# Patient Record
Sex: Male | Born: 1975 | Race: White | Hispanic: No | Marital: Married | Smoking: Never smoker
Health system: Southern US, Community
[De-identification: ages and names within clinical notes are randomized; demographics above are authoritative.]

## PROBLEM LIST (undated history)

## (undated) DIAGNOSIS — I5022 Chronic systolic (congestive) heart failure: Secondary | ICD-10-CM

## (undated) DIAGNOSIS — R55 Syncope and collapse: Secondary | ICD-10-CM

## (undated) DIAGNOSIS — R001 Bradycardia, unspecified: Secondary | ICD-10-CM

## (undated) HISTORY — DX: Syncope and collapse: R55

## (undated) HISTORY — DX: Bradycardia, unspecified: R00.1

## (undated) HISTORY — DX: Chronic systolic (congestive) heart failure: I50.22

## (undated) HISTORY — PX: CERVICAL SPINE SURGERY: SHX589

---

## 2007-05-03 ENCOUNTER — Ambulatory Visit: Payer: Self-pay | Admitting: Chiropractic Medicine

## 2007-06-10 ENCOUNTER — Ambulatory Visit: Payer: Self-pay | Admitting: Pain Medicine

## 2007-06-19 ENCOUNTER — Ambulatory Visit: Payer: Self-pay | Admitting: Pain Medicine

## 2007-07-09 ENCOUNTER — Ambulatory Visit: Payer: Self-pay | Admitting: Pain Medicine

## 2007-07-15 ENCOUNTER — Ambulatory Visit: Payer: Self-pay | Admitting: Pain Medicine

## 2007-08-22 ENCOUNTER — Ambulatory Visit: Payer: Self-pay | Admitting: Neurology

## 2007-09-03 ENCOUNTER — Ambulatory Visit: Payer: Self-pay | Admitting: Pain Medicine

## 2007-09-04 ENCOUNTER — Encounter: Payer: Self-pay | Admitting: Neurology

## 2007-09-25 ENCOUNTER — Ambulatory Visit: Payer: Self-pay | Admitting: Pain Medicine

## 2007-09-30 ENCOUNTER — Encounter: Payer: Self-pay | Admitting: Neurology

## 2010-06-22 ENCOUNTER — Ambulatory Visit: Payer: Self-pay | Admitting: Family Medicine

## 2013-01-31 ENCOUNTER — Ambulatory Visit: Payer: Self-pay | Admitting: Neurology

## 2013-01-31 LAB — CSF CELL COUNT WITH DIFFERENTIAL
CSF Tube #: 1
Eosinophil: 0 %
Lymphocytes: 75 %
Neutrophils: 0 %
Neutrophils: 0 %
Other Cells: 0 %
Other Cells: 0 %
RBC (CSF): 49 /mm3
RBC (CSF): 99 /mm3
WBC (CSF): 11 /mm3
WBC (CSF): 5 /mm3

## 2013-01-31 LAB — PROTIME-INR: INR: 1

## 2013-01-31 LAB — GLUCOSE, CSF: Glucose, CSF: 62 mg/dL (ref 40–75)

## 2013-01-31 LAB — PLATELET COUNT: Platelet: 205 10*3/uL (ref 150–440)

## 2013-02-03 LAB — CSF CULTURE W GRAM STAIN

## 2013-02-20 ENCOUNTER — Ambulatory Visit: Payer: Self-pay | Admitting: Neurology

## 2013-04-11 ENCOUNTER — Ambulatory Visit: Payer: Self-pay | Admitting: Internal Medicine

## 2013-04-18 ENCOUNTER — Ambulatory Visit: Payer: Self-pay | Admitting: Internal Medicine

## 2013-08-18 ENCOUNTER — Ambulatory Visit: Payer: Self-pay | Admitting: Physician Assistant

## 2013-08-18 LAB — COMPREHENSIVE METABOLIC PANEL
ALBUMIN: 4 g/dL (ref 3.4–5.0)
ANION GAP: 6 — AB (ref 7–16)
Alkaline Phosphatase: 84 U/L
BUN: 16 mg/dL (ref 7–18)
Bilirubin,Total: 0.7 mg/dL (ref 0.2–1.0)
CREATININE: 0.94 mg/dL (ref 0.60–1.30)
Calcium, Total: 9.2 mg/dL (ref 8.5–10.1)
Chloride: 103 mmol/L (ref 98–107)
Co2: 32 mmol/L (ref 21–32)
EGFR (Non-African Amer.): >60
Glucose: 92 mg/dL (ref 65–99)
Osmolality: 282 (ref 275–301)
Potassium: 4.3 mmol/L (ref 3.5–5.1)
SGOT(AST): 21 U/L (ref 15–37)
SGPT (ALT): 40 U/L (ref 12–78)
SODIUM: 141 mmol/L (ref 136–145)
TOTAL PROTEIN: 7.5 g/dL (ref 6.4–8.2)

## 2013-08-18 LAB — CBC WITH DIFFERENTIAL/PLATELET
Basophil #: 0 10*3/uL (ref 0.0–0.1)
Basophil %: 0.5 %
EOS PCT: 1.5 %
Eosinophil #: 0.1 10*3/uL (ref 0.0–0.7)
HCT: 46.3 % (ref 40.0–52.0)
HGB: 15.5 g/dL (ref 13.0–18.0)
Lymphocyte #: 2.4 10*3/uL (ref 1.0–3.6)
Lymphocyte %: 30 %
MCH: 29.7 pg (ref 26.0–34.0)
MCHC: 33.5 g/dL (ref 32.0–36.0)
MCV: 89 fL (ref 80–100)
MONO ABS: 0.9 x10 3/mm (ref 0.2–1.0)
Monocyte %: 11.2 %
NEUTROS ABS: 4.5 10*3/uL (ref 1.4–6.5)
Neutrophil %: 56.8 %
PLATELETS: 225 10*3/uL (ref 150–440)
RBC: 5.23 10*6/uL (ref 4.40–5.90)
RDW: 12.8 % (ref 11.5–14.5)
WBC: 8 10*3/uL (ref 3.8–10.6)

## 2013-08-18 LAB — URINALYSIS, COMPLETE
BILIRUBIN, UR: NEGATIVE
BLOOD: NEGATIVE
Bacteria: NEGATIVE
GLUCOSE, UR: NEGATIVE mg/dL (ref 0–75)
KETONE: NEGATIVE
Leukocyte Esterase: NEGATIVE
Nitrite: NEGATIVE
PROTEIN: NEGATIVE
Ph: 6.5 (ref 4.5–8.0)
Specific Gravity: 1.005 (ref 1.003–1.030)
Squamous Epithelial: NONE SEEN
WBC UR: NONE SEEN /HPF (ref 0–5)

## 2013-08-19 LAB — URINE CULTURE

## 2013-11-21 DIAGNOSIS — M5412 Radiculopathy, cervical region: Secondary | ICD-10-CM | POA: Insufficient documentation

## 2014-03-06 DIAGNOSIS — M5136 Other intervertebral disc degeneration, lumbar region: Secondary | ICD-10-CM | POA: Insufficient documentation

## 2014-03-10 ENCOUNTER — Ambulatory Visit: Payer: Self-pay | Admitting: Physical Medicine and Rehabilitation

## 2014-09-12 ENCOUNTER — Ambulatory Visit
Admission: EM | Admit: 2014-09-12 | Discharge: 2014-09-12 | Disposition: A | Discharge status: Home / Self Care | Payer: Managed Care, Other (non HMO) | Attending: Family Medicine | Admitting: Family Medicine

## 2014-09-12 ENCOUNTER — Encounter: Payer: Self-pay | Admitting: Gynecology

## 2014-09-12 DIAGNOSIS — J029 Acute pharyngitis, unspecified: Secondary | ICD-10-CM | POA: Diagnosis not present

## 2014-09-12 LAB — RAPID STREP SCREEN (MED CTR MEBANE ONLY): STREPTOCOCCUS, GROUP A SCREEN (DIRECT): NEGATIVE

## 2014-09-12 NOTE — ED Provider Notes (Signed)
CSN: [id number]     Arrival date & time 09/12/14  0825 History   First MD Initiated Contact with Patient 09/12/14 [phone]     Chief Complaint  Patient presents with  . Sore Throat   (Consider location/radiation/quality/duration/timing/severity/associated sxs/prior Treatment) HPI      39 year old male presents for evaluation of sore throat. This started about 3 days ago after [place] [facility] [place]. He describes an extremely sore throat and difficulty swallowing  He also has had some rhinorrhea and a very mild cough. Symptoms have been gradually worsening. No fever, chills, NVD, headache, chest pain, shortness of breath, sinus pain or pressure   History reviewed. No pertinent past medical history. History reviewed. No pertinent past surgical history. History reviewed. No pertinent family history. History  Substance Use Topics  . Smoking status: Never Smoker   . Smokeless tobacco: Not on file  . Alcohol Use: No    Review of Systems  Constitutional: Negative for fever and chills.  HENT: Positive for rhinorrhea and sore throat. Negative for ear pain and sinus pressure.   Respiratory: Positive for cough. Negative for shortness of breath.   Cardiovascular: Negative for chest pain.  All other systems reviewed and are negative.   Allergies  Review of patient's allergies indicates no known allergies.  Home Medications   Prior to Admission medications   Not on File   BP 133/95 mmHg  Pulse 49  Temp(Src) 96 F (35.6 C) (Tympanic)  Ht 5\' 10"  (1.778 m)  Wt 169 lb (76.658 kg)  BMI 24.25 kg/m2  SpO2 98% Physical Exam  Constitutional: He is oriented to person, place, and time. He appears well-developed and well-nourished. No distress.  HENT:  Head: Normocephalic and atraumatic.  Right Ear: External ear normal.  Left Ear: External ear normal.  Nose: Nose normal. Right sinus exhibits no maxillary sinus tenderness and no frontal sinus tenderness. Left sinus exhibits no  maxillary sinus tenderness and no frontal sinus tenderness.  Mouth/Throat: Uvula is midline, oropharynx is clear and moist and mucous membranes are normal. No oropharyngeal exudate or posterior oropharyngeal erythema.  Eyes: Conjunctivae are normal.  Neck: Normal range of motion. Neck supple.  Cardiovascular: Normal rate, regular rhythm and normal heart sounds.   Pulmonary/Chest: Effort normal and breath sounds normal. No respiratory distress.  Lymphadenopathy:    He has no cervical adenopathy.  Neurological: He is alert and oriented to person, place, and time. Coordination normal.  Skin: Skin is warm and dry. No rash noted. He is not diaphoretic.  Psychiatric: He has a normal mood and affect. Judgment normal.  Nursing note and vitals reviewed.   ED Course  Procedures (including critical care time) Labs Review Labs Reviewed  RAPID STREP SCREEN    Imaging Review No results found.   MDM   1. Pharyngitis     the physical exam is completely normal, throat appears normal. Most likely viral. Treat symptomatically, I have written him a prescription for Magic mouthwash as well. He will follow-up in 4-5 days if he is not getting any better or sooner if worsening      Graylon GoodZachary H Oliviya Gilkison, PA-C 09/12/14 [phone]

## 2014-09-12 NOTE — Discharge Instructions (Signed)
Your sore throat is most likely being caused by a virus. The Magic mouthwash medication as needed for the sore throat. Also take ibuprofen and Tylenol, and sore throat lozenges as necessary. If this is not getting any better in about 5 days, follow-up here or with your primary care provider for reevaluation. You do not need any antibiotics today.  Pharyngitis Pharyngitis is redness, pain, and swelling (inflammation) of your pharynx.  CAUSES  Pharyngitis is usually caused by infection. Most of the time, these infections are from viruses (viral) and are part of a cold. However, sometimes pharyngitis is caused by bacteria (bacterial). Pharyngitis can also be caused by allergies. Viral pharyngitis may be spread from person to person by coughing, sneezing, and personal items or utensils (cups, forks, spoons, toothbrushes). Bacterial pharyngitis may be spread from person to person by more intimate contact, such as kissing.  SIGNS AND SYMPTOMS  Symptoms of pharyngitis include:   Sore throat.   Tiredness (fatigue).   Low-grade fever.   Headache.  Joint pain and muscle aches.  Skin rashes.  Swollen lymph nodes.  Plaque-like film on throat or tonsils (often seen with bacterial pharyngitis). DIAGNOSIS  Your health care provider will ask you questions about your illness and your symptoms. Your medical history, along with a physical exam, is often all that is needed to diagnose pharyngitis. Sometimes, a rapid strep test is done. Other lab tests may also be done, depending on the suspected cause.  TREATMENT  Viral pharyngitis will usually get better in 3-4 days without the use of medicine. Bacterial pharyngitis is treated with medicines that kill germs (antibiotics).  HOME CARE INSTRUCTIONS   Drink enough water and fluids to keep your urine clear or pale yellow.   Only take over-the-counter or prescription medicines as directed by your health care provider:   If you are prescribed  antibiotics, make sure you finish them even if you start to feel better.   Do not take aspirin.   Get lots of rest.   Gargle with 8 oz of salt water ( tsp of salt per 1 qt of water) as often as every 1-2 hours to soothe your throat.   Throat lozenges (if you are not at risk for choking) or sprays may be used to soothe your throat. SEEK MEDICAL CARE IF:   You have large, tender lumps in your neck.  You have a rash.  You cough up green, yellow-brown, or bloody spit. SEEK IMMEDIATE MEDICAL CARE IF:   Your neck becomes stiff.  You drool or are unable to swallow liquids.  You vomit or are unable to keep medicines or liquids down.  You have severe pain that does not go away with the use of recommended medicines.  You have trouble breathing (not caused by a stuffy nose). MAKE SURE YOU:   Understand these instructions.  Will watch your condition.  Will get help right away if you are not doing well or get worse. Document Released: 04/17/2005 Document Revised: 02/05/2013 Document Reviewed: 12/23/2012 [facility] Patient Information 2015 [place], [place]. This information is not intended to replace advice given to you by your health care provider. Make sure you discuss any questions you have with your health care provider.

## 2014-09-12 NOTE — ED Notes (Signed)
Patient c/o sore throat / painful to swallow x 3 days.

## 2014-09-15 LAB — CULTURE, GROUP A STREP (THRC)

## 2014-09-19 IMAGING — RF LUMBAR PUNCTURE FLUORO GUIDE
1 series · 2 of 2 positions shown · non-contrast
Comparison: none

REASON FOR EXAM: headaches  numbness  tingling
COMMENTS:

PROCEDURE:     FL  - FL  GUIDED LUMBAR PUNCTURE  - January 31, 2013  [DATE]
RESULT:
TECHNIQUE: The patient was informed of the risks and benefits of the
procedure and proper informed consent was obtained. The patient was brought
to the fluoroscopy suite and placed in a prone position. Proper entry site
for fluoroscopically guided lumbar puncture was established. The overlying
soft tissues were then prepped and draped in the usual sterile fashion.
Utilizing 4 ml of 1% Lidocaine without epinephrine, the overlying soft
tissues were anesthetized. The thecal sac was cannulated with a 3.5 inch, 22
gauge spinal needle. Clear CSF is appreciated within the hub of the needle.
8.5 ml of clear CSF was removed from the thecal sac. The needle was removed
and the patient tolerated the procedure without complications.

[Series 1: fluoro_iodine 2fps_bw · 0.17mm/px · 2 of 2 frames shown]
[frame 1/2]
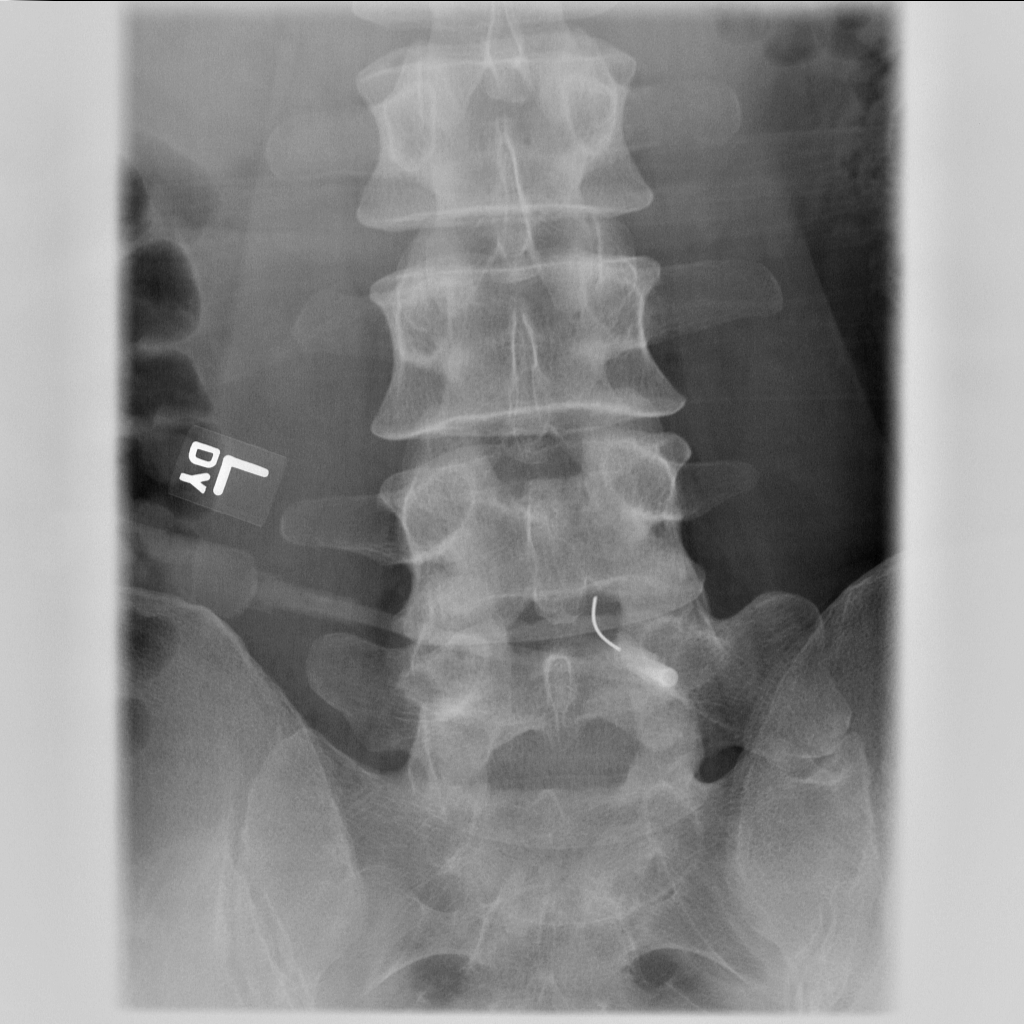
[frame 2/2]
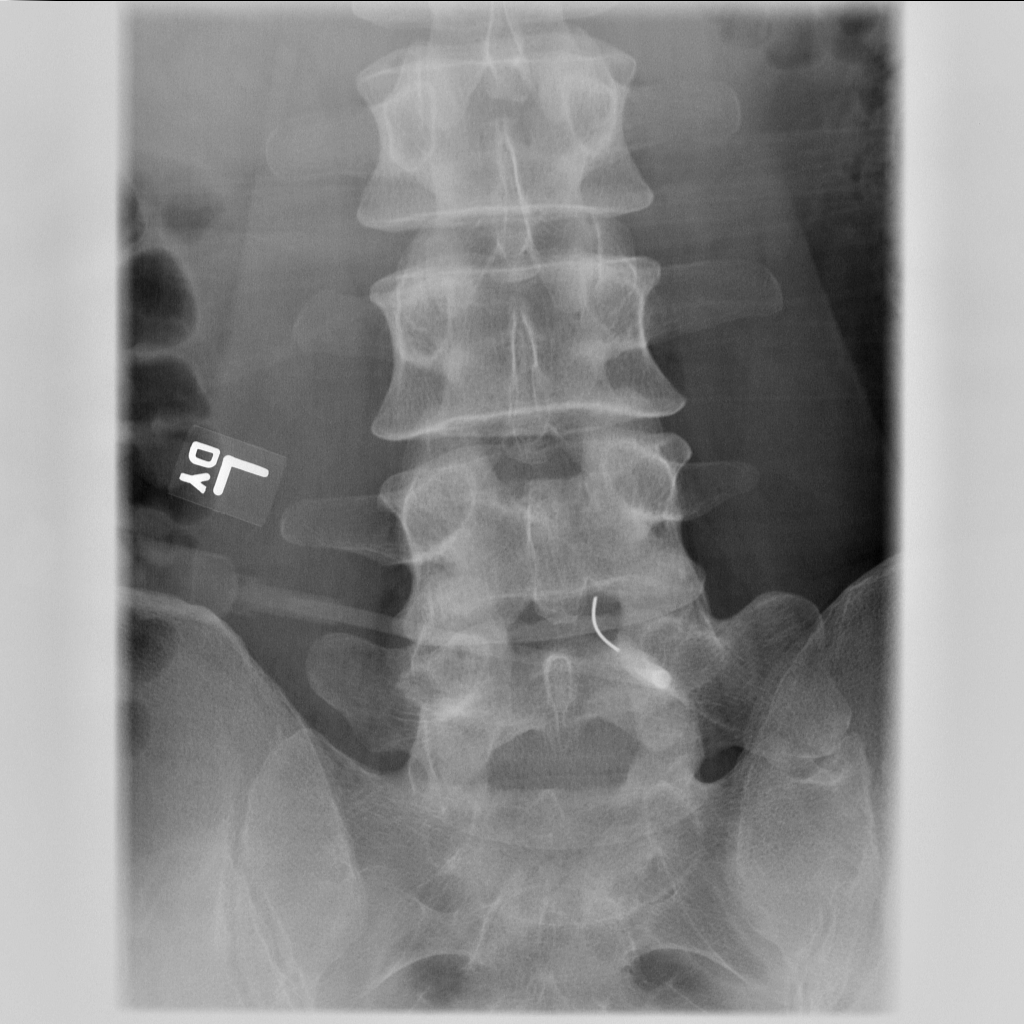

[2 of 2 positions shown; findings below may reference images not displayed]

IMPRESSION: Fluoroscopically guided lumbar puncture as described above.

## 2014-12-05 IMAGING — CT CT CHEST W/ CM
2 of 3 series · 15 of 36 positions shown, 18 images · IV contrast (APPLIED)
Comparison: None.

CLINICAL DATA: Fatigue and shortness of breath

EXAM:
CT CHEST WITH CONTRAST
TECHNIQUE: Multidetector CT imaging of the chest was performed during
intravenous contrast administration.
CONTRAST:  100 mL Isovue 370 nonionic

[Series 4: routine chest with · axial · 0.73mm/px · z∈[-698,-398]mm · 12 of 72 slices shown, 15 images]
[im 6/72  mediastinal]
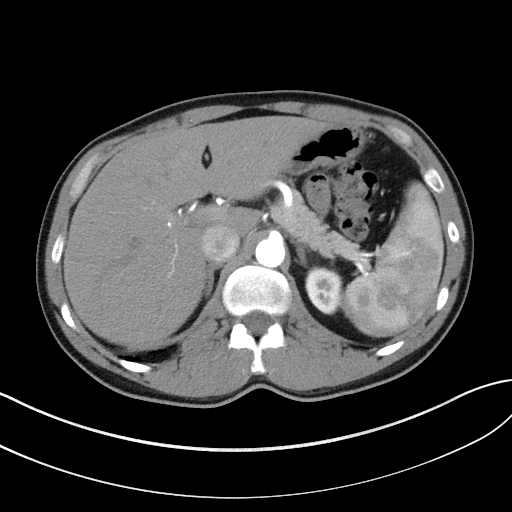
[im 6/72  lung]
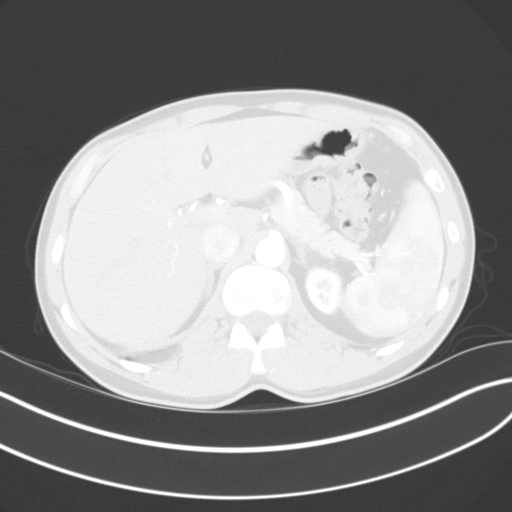
[im 11/72  lung]
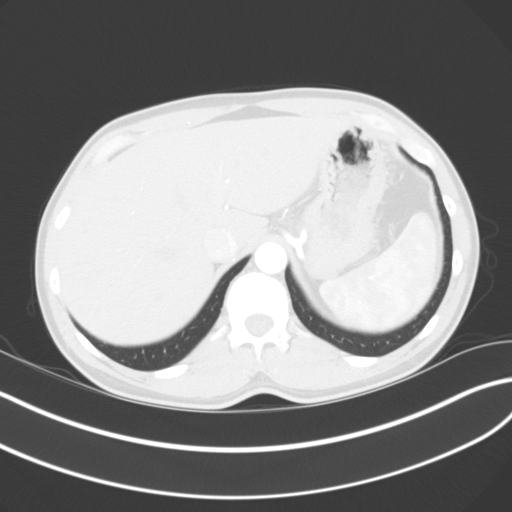
[im 16/72  lung]
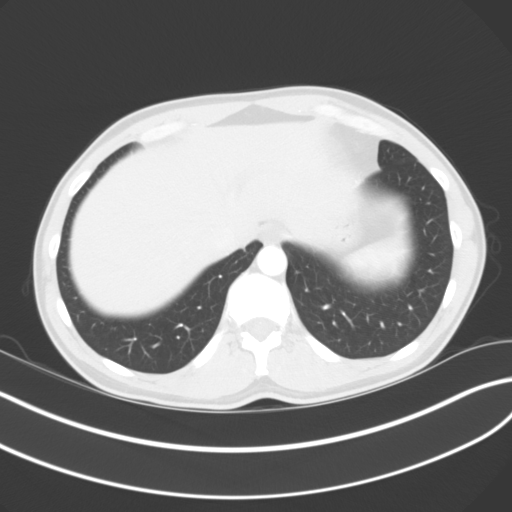
[im 22/72  lung]
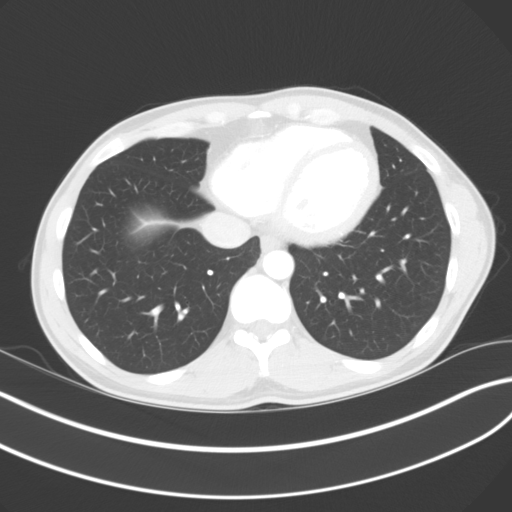
[im 27/72  mediastinal]
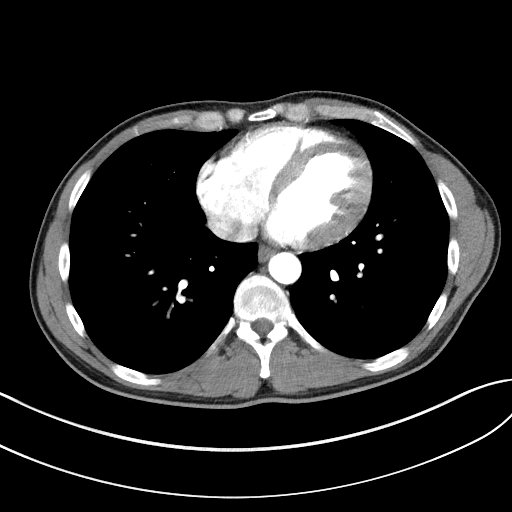
[im 27/72  lung]
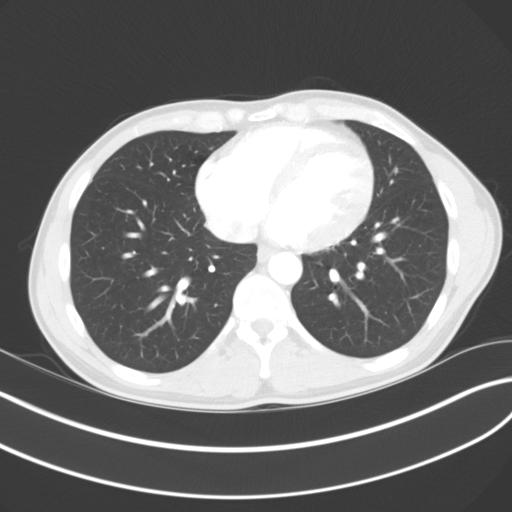
[im 32/72  lung]
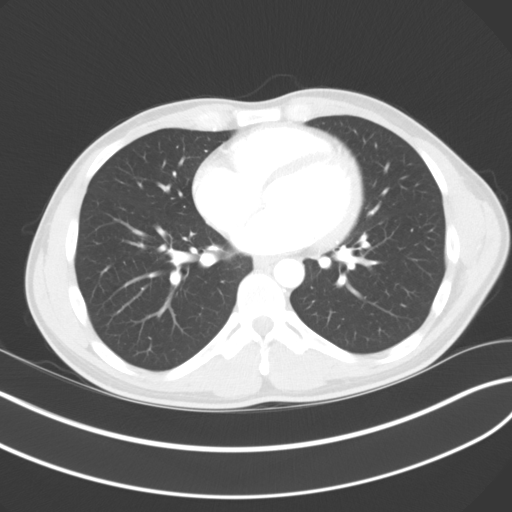
[im 40/72  lung]
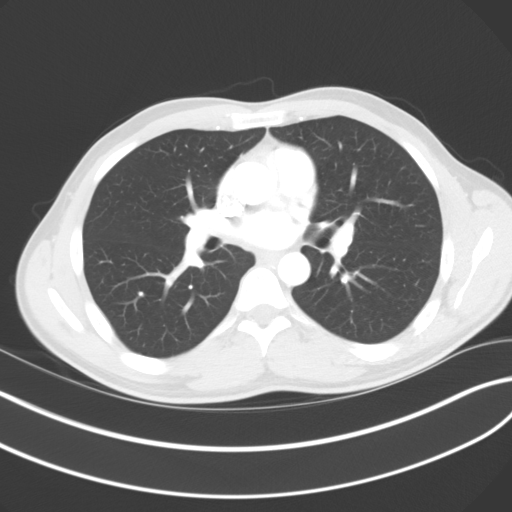
[im 45/72  lung]
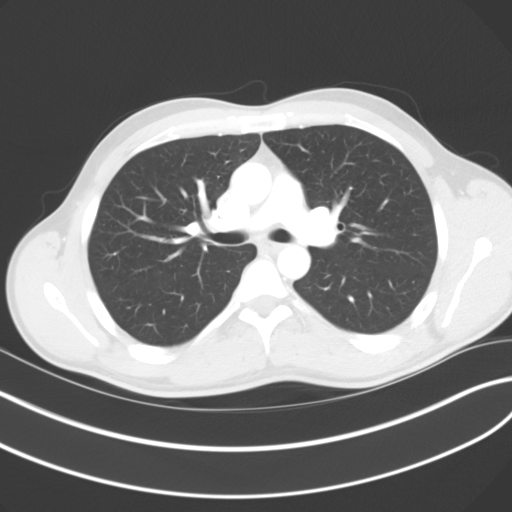
[im 50/72  mediastinal]
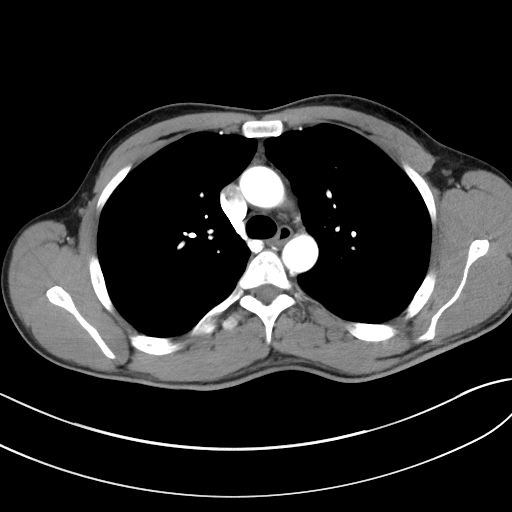
[im 50/72  lung]
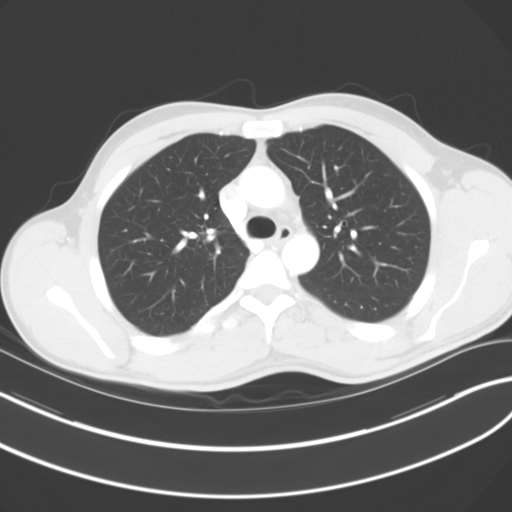
[im 56/72  lung]
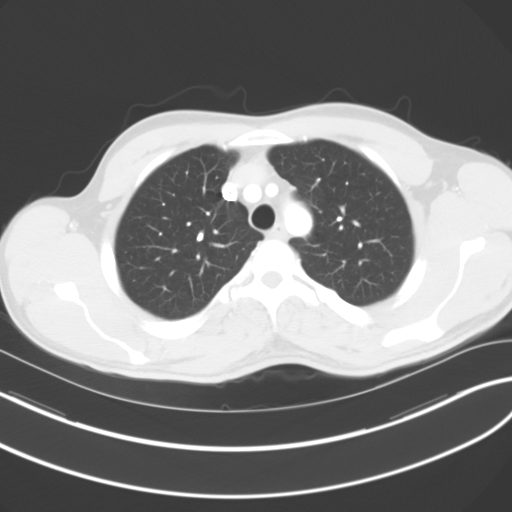
[im 61/72  lung]
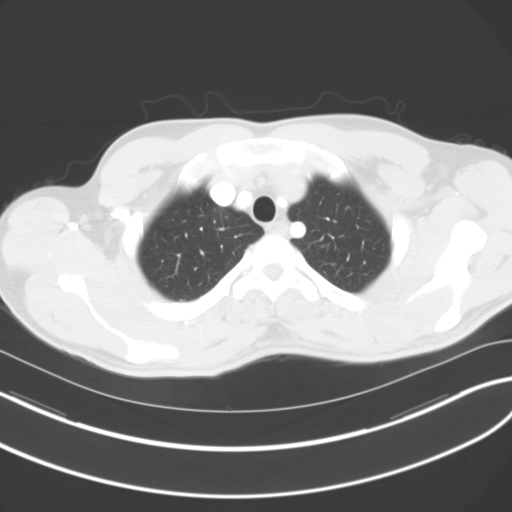
[im 66/72  lung]
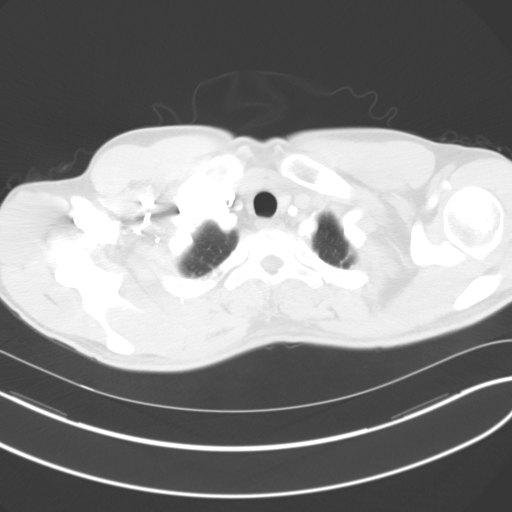

[Series 7: cor routine chest with · coronal · 0.73mm/px · 3 of 126 slices shown]
[im 26/126  lung]
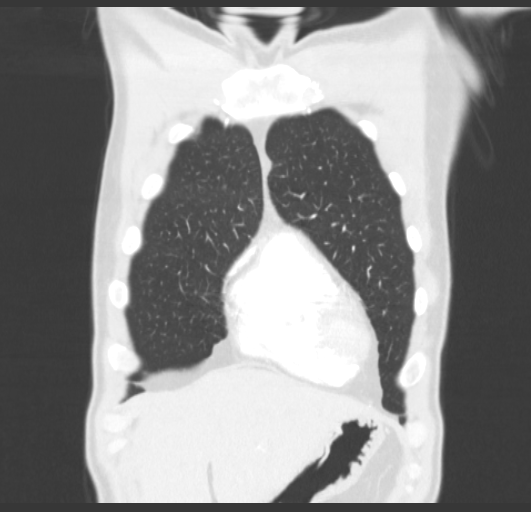
[im 51/126  lung]
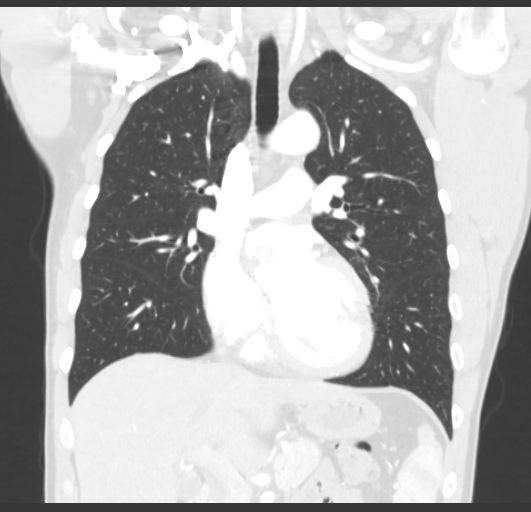
[im 76/126  lung]
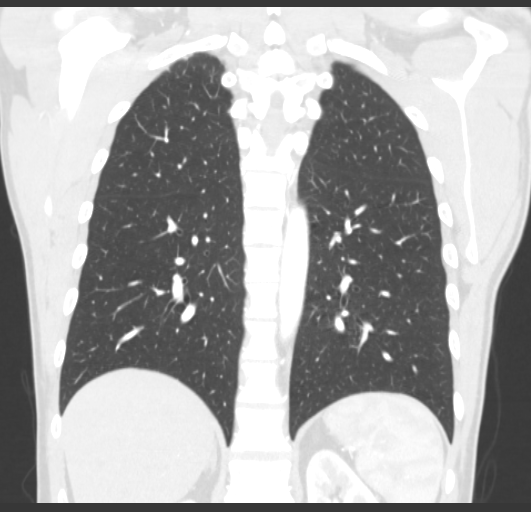

[15 of 36 positions shown; findings below may reference images not displayed]

FINDINGS: There are several healed rib fractures on the right posteriorly. No
acute fracture seen. There is some minimal pleural thickening in
areas of previous rib fractures. Elsewhere lungs are clear.

There is no appreciable thoracic adenopathy. Pericardium is not
thickened.

In the visualized upper abdomen, there is fatty liver. Visualized
upper abdominal structures otherwise appear normal. There are no
blastic or lytic bone lesions. Thyroid appears normal.
IMPRESSION: Evidence of old rib trauma on the right posteriorly. No edema or
consolidation. The interstitium appears normal. No adenopathy.

## 2015-09-21 DIAGNOSIS — R001 Bradycardia, unspecified: Secondary | ICD-10-CM | POA: Insufficient documentation

## 2015-10-20 DIAGNOSIS — I493 Ventricular premature depolarization: Secondary | ICD-10-CM | POA: Insufficient documentation

## 2015-10-20 DIAGNOSIS — R002 Palpitations: Secondary | ICD-10-CM | POA: Insufficient documentation

## 2016-07-17 DIAGNOSIS — G8929 Other chronic pain: Secondary | ICD-10-CM | POA: Insufficient documentation

## 2017-05-16 ENCOUNTER — Other Ambulatory Visit: Payer: Self-pay | Admitting: Orthopaedic Surgery

## 2017-05-16 DIAGNOSIS — M5090 Cervical disc disorder, unspecified, unspecified cervical region: Secondary | ICD-10-CM

## 2017-07-26 DIAGNOSIS — M4722 Other spondylosis with radiculopathy, cervical region: Secondary | ICD-10-CM | POA: Insufficient documentation

## 2018-01-02 ENCOUNTER — Other Ambulatory Visit: Payer: Self-pay

## 2018-01-02 ENCOUNTER — Emergency Department
Admission: EM | Admit: 2018-01-02 | Discharge: 2018-01-02 | Disposition: A | Discharge status: Home / Self Care | Payer: BLUE CROSS/BLUE SHIELD | Attending: Emergency Medicine | Admitting: Emergency Medicine

## 2018-01-02 ENCOUNTER — Encounter: Payer: Self-pay | Admitting: *Deleted

## 2018-01-02 DIAGNOSIS — R55 Syncope and collapse: Secondary | ICD-10-CM | POA: Diagnosis not present

## 2018-01-02 DIAGNOSIS — R42 Dizziness and giddiness: Secondary | ICD-10-CM | POA: Diagnosis not present

## 2018-01-02 DIAGNOSIS — F17228 Nicotine dependence, chewing tobacco, with other nicotine-induced disorders: Secondary | ICD-10-CM | POA: Insufficient documentation

## 2018-01-02 DIAGNOSIS — I499 Cardiac arrhythmia, unspecified: Secondary | ICD-10-CM | POA: Diagnosis not present

## 2018-01-02 DIAGNOSIS — I498 Other specified cardiac arrhythmias: Secondary | ICD-10-CM

## 2018-01-02 LAB — BASIC METABOLIC PANEL
ANION GAP: 7 (ref 5–15)
BUN: 20 mg/dL (ref 6–20)
CALCIUM: 9.4 mg/dL (ref 8.9–10.3)
CO2: 29 mmol/L (ref 22–32)
CREATININE: 0.94 mg/dL (ref 0.61–1.24)
Chloride: 104 mmol/L (ref 98–111)
GFR calc Af Amer: >60 mL/min (ref >60)
Glucose, Bld: 119 mg/dL — ABNORMAL HIGH (ref 70–99)
Potassium: 3.6 mmol/L (ref 3.5–5.1)
Sodium: 140 mmol/L (ref 135–145)

## 2018-01-02 LAB — CBC
HCT: 43.9 % (ref 40.0–52.0)
HEMOGLOBIN: 15.5 g/dL (ref 13.0–18.0)
MCH: 30.7 pg (ref 26.0–34.0)
MCHC: 35.3 g/dL (ref 32.0–36.0)
MCV: 86.9 fL (ref 80.0–100.0)
PLATELETS: 226 10*3/uL (ref 150–440)
RBC: 5.05 MIL/uL (ref 4.40–5.90)
RDW: 13.4 % (ref 11.5–14.5)
WBC: 11.6 10*3/uL — AB (ref 3.8–10.6)

## 2018-01-02 LAB — URINALYSIS, COMPLETE (UACMP) WITH MICROSCOPIC
Bacteria, UA: NONE SEEN
Bilirubin Urine: NEGATIVE
Glucose, UA: NEGATIVE mg/dL
KETONES UR: NEGATIVE mg/dL
Leukocytes, UA: NEGATIVE
NITRITE: NEGATIVE
PH: 6 (ref 5.0–8.0)
Protein, ur: NEGATIVE mg/dL
Specific Gravity, Urine: 1.008 (ref 1.005–1.030)
Squamous Epithelial / LPF: NONE SEEN (ref 0–5)

## 2018-01-02 LAB — TROPONIN I: Troponin I: <0.03 ng/mL (ref <0.03)

## 2018-01-02 LAB — MAGNESIUM: Magnesium: 2.3 mg/dL (ref 1.7–2.4)

## 2018-01-02 NOTE — ED Provider Notes (Addendum)
[facility] Emergency Department Provider Note  ____________________________________________   I have reviewed the triage vital signs and the nursing notes. Where available I have reviewed prior notes and, if possible and indicated, outside hospital notes.    HISTORY  Chief Complaint Loss of Consciousness    HPI Marc Dominguez is a 42 y.o. male  He states for years he has been bradycardic.  He has had work-up including Holter monitors and a heart cath he states for this.  Patient states that he also every 6 months or so has a syncopal or presyncopal event.  Family were with him today.  He states that he was feeling lightheaded, 'like when you are hungry and you have taken a pain pill after surgery and you know you not going to feel well" (patient states he does not use narcotics.)  And he became lightheaded he tried to drink some water and then he passed out.  Family saw him do so.  No injury was noted.  He did not have seizure activity but he seemed out of it for some time.  No postictal.  Did not bite his tongue, no loss of bowel or bladder.  No chest pain or shortness of breath.  Patient states that he has had multiple episodes where he felt like this was going to happen but he cannot remember actually passing out his wife states that this is a more extreme version of his normal event.  Patient had one meal today which is not atypical for him.  It was baked spaghetti around 230.  He also had coffee which he normally has and an energy drink which he does not normally have.  He denies any recent illness he denies any stimulants and he feels better at this time.  Takes no medications and has not no recent tick bites he also denies any supplements or Chinese remedies etc.     History reviewed. No pertinent past medical history.  There are no active problems to display for this patient.   Past Surgical History:  Procedure Laterality Date  . CERVICAL SPINE SURGERY       Prior to Admission medications   Not on File    Allergies Patient has no known allergies.  No family history on file.  Social History Social History   Tobacco Use  . Smoking status: Never Smoker  . Smokeless tobacco: Current User  Substance Use Topics  . Alcohol use: Yes  . Drug use: No    Review of Systems Constitutional: No fever/chills Eyes: No visual changes. ENT: No sore throat. No stiff neck no neck pain Cardiovascular: Denies chest pain. Respiratory: Denies shortness of breath. Gastrointestinal:   no vomiting.  No diarrhea.  No constipation. Genitourinary: Negative for dysuria. Musculoskeletal: Negative lower extremity swelling Skin: Negative for rash. Neurological: Negative for severe headaches, focal weakness or numbness.   ____________________________________________   PHYSICAL EXAM:  VITAL SIGNS: ED Triage Vitals  Enc Vitals Group     BP 01/02/18 1903 124/81     Pulse Rate 01/02/18 1903 (!) 46     Resp 01/02/18 1903 16     Temp 01/02/18 1903 97.6 F (36.4 C)     Temp Source 01/02/18 1903 Oral     SpO2 01/02/18 1903 98 %     Weight 01/02/18 1906 174 lb (78.9 kg)     Height 01/02/18 1906 5\' 9"  (1.753 m)     Head Circumference --      Peak Flow --  Pain Score 01/02/18 1904 0     Pain Loc --      Pain Edu? --      Excl. in GC? --     Constitutional: Alert and oriented. Well appearing and in no acute distress. Eyes: Conjunctivae are normal Head: Atraumatic HEENT: No congestion/rhinnorhea. Mucous membranes are moist.  Oropharynx non-erythematous Neck:   Nontender with no meningismus, no masses, no stridor Cardiovascular: T bradycardia noted, regular rhythm. Grossly normal heart sounds.  Good peripheral circulation. Respiratory: Normal respiratory effort.  No retractions. Lungs CTAB. Abdominal: Soft and nontender. No distention. No guarding no rebound Back:  There is no focal tenderness or step off.  there is no midline tenderness there  are no lesions noted. there is no CVA tenderness Musculoskeletal: No lower extremity tenderness, no upper extremity tenderness. No joint effusions, no DVT signs strong distal pulses no edema Neurologic:  Normal speech and language. No gross focal neurologic deficits are appreciated.  Skin:  Skin is warm, dry and intact. No rash noted. Psychiatric: Mood and affect are normal. Speech and behavior are normal.  ____________________________________________   LABS (all labs ordered are listed, but only abnormal results are displayed)  Labs Reviewed  BASIC METABOLIC PANEL - Abnormal; Notable for the following components:      Result Value   Glucose, Bld 119 (*)    All other components within normal limits  CBC - Abnormal; Notable for the following components:   WBC 11.6 (*)    All other components within normal limits  TROPONIN I  URINALYSIS, COMPLETE (UACMP) WITH MICROSCOPIC  MAGNESIUM  CBG MONITORING, ED    Pertinent labs  results that were available during my care of the patient were reviewed by me and considered in my medical decision making (see chart for details). ____________________________________________  EKG  I personally interpreted any EKGs ordered by me or triage 2 EKGs were performed on this patient, the first showed sinus bradycardia rate 40 bpm, no acute ST elevation or depression ossific ST changes noted. The second showed sinus bradycardia at most likely underlying rhythm in the 40s with PVCs/bigeminy noted no other acute change. ____________________________________________  RADIOLOGY  Pertinent labs & imaging results that were available during my care of the patient were reviewed by me and considered in my medical decision making (see chart for details). If possible, patient and/or family made aware of any abnormal findings.  No results found. ____________________________________________    PROCEDURES  Procedure(s) performed: None  Procedures  Critical  Care performed: None  ____________________________________________   INITIAL IMPRESSION / ASSESSMENT AND PLAN / ED COURSE  Pertinent labs & imaging results that were available during my care of the patient were reviewed by me and considered in my medical decision making (see chart for details).  Patient is here after a syncopal event, he did not have chest pain he did have a prodrome where he felt somewhat lightheaded.  This is something is happened to him for years.  He has had extensive outpatient work-up including Holter monitor which showed PACs and PVCs and a short run of supraventricular tachycardia of some variety, patient is under some stress as his mother is in the hospital and he is visiting her he also had an atypically high caffeine intake today.  He has no complaints at this time but we do notice that he has gone in and out of ventricular bigeminy.  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  We will discuss with cardiology what the next step for this patient has  ----------------------------------------- 10:19 PM on 01/02/2018 -----------------------------------------  Remains with no symptoms, he declines admission to the hospital, we did discuss with Dr. Arnoldo Hooker, he feels the patient is not at risk for any likely decompensation.  We talked about prior work-up, EKG repeat EKG symptoms and signs etc.  Patient has no history of HOCM with negative prior ultrasounds etc.  Dr. Gwen Pounds feels the patient is very safe for discharge and close follow-up with them tomorrow and they will make room for him on the schedule.  Patient is very comfortable with this.  If troponin is negative it is my hope that we can get him safely home.  He has no complaints.  Dr. Gwen Pounds  aware that the patient has been in and out of bigeminy here, he states that his long-standing problems does not mandate admission to the hospital.    ____________________________________________   FINAL CLINICAL IMPRESSION(S) / ED DIAGNOSES  Final diagnoses:  None      This chart was dictated using voice recognition software.  Despite best efforts to proofread,  errors can occur which can change meaning.      Jeanmarie Plant, MD 01/02/18 2031    Jeanmarie Plant, MD 01/02/18 2220

## 2018-01-02 NOTE — Discharge Instructions (Addendum)
Take it easy until you can see cardiology.  They will see you tomorrow.  Drink plenty of fluids.  Avoid significantly caffeinated drinks especially d as you do not normally drink.  The cardiologist will make room for you tomorrow.  Be very careful climbing ladders, soaking the tub, driving or doing anything else which, if interrupted by the feeling of needing to pass out could cause you harm.  Eat plenty of food and drink plenty of fluids.  If you do feel that you might pass out, please make sure that you are in a safe place as soon as possible return to the emergency room.  Return to the emergency room for any new or worrisome symptoms as discussed

## 2018-01-02 NOTE — ED Notes (Signed)
FIRST NURSE NOTE:  Pt visiting another patient in the hospital and fell and hit the back of his head, pt appears pale on arrival at this time.  Pt is alert and talking at this time.

## 2018-01-02 NOTE — ED Notes (Signed)
ED Provider at bedside. 

## 2018-01-02 NOTE — ED Triage Notes (Signed)
Pt was visting in the hospital, reports he was standing there, started to feel lightheaded and then passed out. Pt says this "happens once or twice a year for about 12 years", has never been evaluated for the same. Pt denies any cp, dizziness, nausea or any other symptoms at this time. Alert/oriented and clear speech.  Staff that brought pt to the ED had checked pt glucose and said it was 94.

## 2018-01-10 DIAGNOSIS — R0602 Shortness of breath: Secondary | ICD-10-CM | POA: Insufficient documentation

## 2021-04-20 ENCOUNTER — Ambulatory Visit: Payer: No Typology Code available for payment source | Admitting: Internal Medicine

## 2021-04-20 ENCOUNTER — Encounter: Payer: Self-pay | Admitting: Internal Medicine

## 2021-04-20 VITALS — BP 114/62 | HR 53 | Temp 98.2°F | Resp 16 | Ht 70.0 in | Wt 158.7 lb

## 2021-04-20 DIAGNOSIS — R3912 Poor urinary stream: Secondary | ICD-10-CM

## 2021-04-20 DIAGNOSIS — H6063 Unspecified chronic otitis externa, bilateral: Secondary | ICD-10-CM

## 2021-04-20 DIAGNOSIS — Z125 Encounter for screening for malignant neoplasm of prostate: Secondary | ICD-10-CM

## 2021-04-20 DIAGNOSIS — R5383 Other fatigue: Secondary | ICD-10-CM

## 2021-04-20 DIAGNOSIS — R001 Bradycardia, unspecified: Secondary | ICD-10-CM | POA: Diagnosis not present

## 2021-04-20 DIAGNOSIS — J069 Acute upper respiratory infection, unspecified: Secondary | ICD-10-CM | POA: Diagnosis not present

## 2021-04-20 DIAGNOSIS — I340 Nonrheumatic mitral (valve) insufficiency: Secondary | ICD-10-CM | POA: Diagnosis not present

## 2021-04-20 DIAGNOSIS — Z13220 Encounter for screening for lipoid disorders: Secondary | ICD-10-CM

## 2021-04-20 DIAGNOSIS — Z23 Encounter for immunization: Secondary | ICD-10-CM | POA: Diagnosis not present

## 2021-04-20 DIAGNOSIS — Z114 Encounter for screening for human immunodeficiency virus [HIV]: Secondary | ICD-10-CM

## 2021-04-20 DIAGNOSIS — Z1159 Encounter for screening for other viral diseases: Secondary | ICD-10-CM

## 2021-04-20 MED ORDER — NEOMYCIN-POLYMYXIN-HC 1 % OT SOLN: 3.0000 [drp] | Freq: Four times a day (QID) | OTIC | 0 refills | Status: DC

## 2021-04-20 MED ORDER — BENZONATATE 100 MG PO CAPS: 100.0000 mg | ORAL_CAPSULE | Freq: Two times a day (BID) | ORAL | 0 refills | Status: DC | PRN

## 2021-04-20 MED ORDER — METHYLPREDNISOLONE 4 MG PO TBPK: ORAL_TABLET | ORAL | 0 refills | Status: DC

## 2021-04-20 MED ORDER — CETIRIZINE HCL 10 MG PO TABS: 10.0000 mg | ORAL_TABLET | Freq: Every day | ORAL | 11 refills | Status: DC

## 2021-04-20 MED ORDER — FLUTICASONE PROPIONATE 50 MCG/ACT NA SUSP: 2.0000 | Freq: Every day | NASAL | 6 refills | Status: DC

## 2021-04-20 NOTE — Patient Instructions (Addendum)
It was great seeing you today!  Plan discussed at today's visit: -Blood work ordered today, results will be uploaded to MyChart.  -EKG today, referral placed to Cardiology  -Medrol dose pack for sinus pain sent to pharmacy, cough suppressant  -Start using Nasal saline gel, Flonase and Zyrtec daily with symptoms  -Flu vaccine today  Follow up in: 3 months  Take care and let us know if you have any questions or concerns prior to your next visit.  Dr. Caralee Ates

## 2021-04-20 NOTE — Progress Notes (Signed)
New Patient Office Visit  Subjective:  Patient ID: Marc Dominguez, male    DOB: 02-07-1976  Age: 45 y.o. MRN: [id number]  CC:  Chief Complaint  Patient presents with   Establish Care   URI    Onset a week before thanksgiving, symptoms include: sinus drainage, pressure   Urinary Frequency    Weak stream   Bradycardia    Low today, but states it drops in the 30's and he feels dizzy, sweaty and feels like he is going to pass out.  States has been checked out, but no answers and is concerned.    HPI Marc Dominguez presents as a new patient.   Chronic Neck Pain: -Had surgery about 3 years ago, takes Tramadol sparingly for pain  Bradycardia:  -On going for years, had cardiac work ups before but could never get an answer and was very frustrated and stopping follow ups -Fatigued constantly, worse since having COVID this past summer -Heart rate will drop to 30's he will become diaphoretic and pre-syncopal, lasts for a few minutes and will resolve after sitting, these episodes happen about 2-3 times a year  -Denies chest pain, palpitations or shortness of breath but does have fatigue  Weak Urinary Stream: -Had this for years now, getting worse -Had a hard time initiating urine stream -Denies dysuria, hematuria but does have hesitancy with urination and some incontinence occasionally   URI Compliant:  -Worst symptom: nasal congestion green productive mucus, cough for 3 weeks -Fever: no -Cough: yes dry -Shortness of breath: no -Wheezing: no -Chest pain: no -Chest tightness: yes -Chest congestion: yes -Nasal congestion: yes -Runny nose: yes -Post nasal drip: yes -Sore throat: yes -Sinus pressure: yes -Headache: yes -Ear pain: yes    -Ear pressure: yes    -Relief with OTC cold/cough medications: no  -Treatments attempted: cold/sinus, mucinex, and cough syrup   Past Medical History:  Diagnosis Date   Bradycardia     Past Surgical History:  Procedure Laterality Date    CERVICAL SPINE SURGERY      Family History  Problem Relation Age of Onset   Hyperlipidemia Mother    Hypertension Mother    Diabetes Mother    Cancer Father        lymphoma   Hypertension Sister    Hyperlipidemia Brother    Hypertension Brother    Alcohol abuse Brother     Social History   Socioeconomic History   Marital status: Married    Spouse name: Not on file   Number of children: Not on file   Years of education: Not on file   Highest education level: Not on file  Occupational History   Not on file  Tobacco Use   Smoking status: Never   Smokeless tobacco: Current    Types: Chew  Vaping Use   Vaping Use: Never used  Substance and Sexual Activity   Alcohol use: Yes   Drug use: No   Sexual activity: Yes  Other Topics Concern   Not on file  Social History Narrative   Not on file   Social Determinants of Health   Financial Resource Strain: Not on file  Food Insecurity: Not on file  Transportation Needs: Not on file  Physical Activity: Not on file  Stress: Not on file  Social Connections: Not on file  Intimate Partner Violence: Not on file    ROS Review of Systems  Constitutional:  Positive for fatigue. Negative for chills and fever.  HENT:  Positive for congestion, ear pain, postnasal drip, rhinorrhea, sinus pressure, sinus pain and sore throat. Negative for hearing loss and tinnitus.   Respiratory:  Positive for cough. Negative for shortness of breath and wheezing.   Cardiovascular:  Negative for chest pain, palpitations and leg swelling.  Gastrointestinal:  Negative for abdominal pain, nausea and vomiting.  Genitourinary:  Positive for decreased urine volume and difficulty urinating. Negative for dysuria, frequency, hematuria and urgency.  Neurological:  Negative for dizziness and headaches.   Objective:   Today's Vitals: BP 114/62    Pulse (!) 53    Temp 98.2 F (36.8 C)    Resp 16    Ht 5\' 10"  (1.778 m)    Wt 158 lb 11.2 oz (72 kg)    SpO2 98%     BMI 22.77 kg/m   Physical Exam Constitutional:      Appearance: Normal appearance.  HENT:     Head: Normocephalic and atraumatic.     Right Ear: Tympanic membrane and ear canal normal.     Left Ear: Tympanic membrane and ear canal normal.     Ears:     Comments: Both external canals red, swollen     Nose: Nose normal.     Mouth/Throat:     Mouth: Mucous membranes are moist.     Pharynx: Oropharynx is clear.  Eyes:     Conjunctiva/sclera: Conjunctivae normal.  Cardiovascular:     Rate and Rhythm: Regular rhythm. Tachycardia present.     Heart sounds: Murmur heard.  Pulmonary:     Effort: Pulmonary effort is normal.     Breath sounds: Normal breath sounds. No wheezing, rhonchi or rales.  Musculoskeletal:     Right lower leg: No edema.     Left lower leg: No edema.  Skin:    General: Skin is warm and dry.  Neurological:     General: No focal deficit present.     Mental Status: He is alert. Mental status is at baseline.  Psychiatric:        Mood and Affect: Mood normal.        Behavior: Behavior normal.    Assessment & Plan:   1. Bradycardia/Other fatigue/Mitral murmur: EKG in the office showing sinus bradycardia with a rate of 45 bmp, no blocks noted, regular rhythm. Murmur heard on exam as well, he states he did see multiple cardiologists but I cannot see these records. With bradycardia, murmur, I am referring him to a new cardiologist for echo and possible pacemaker placement. Blood work including CBC, CMP, TSH, Vitamin b12/folate ordered as well to rule out metabolic causes of fatigue.   - CBC w/Diff/Platelet - COMPLETE METABOLIC PANEL WITH GFR - TSH - EKG 12-Lead - Ambulatory referral to Cardiology - B12 and Folate Panel  2. Upper respiratory tract infection, unspecified type: Nasal congestion/cough x 3 weeks. Treat with Medrol pack, nasal steroids, cough suppressant. Also counseled on starting oral anti-histamine.   - methylPREDNISolone (MEDROL DOSEPAK) 4 MG TBPK  tablet; Day 1: Take 8 mg (2 tablets) before breakfast, 4 mg (1 tablet) after lunch, 4 mg (1 tablet) after supper, and 8 mg (2 tablets) at bedtime. Day 2:Take 4 mg (1 tablet) before breakfast, 4 mg (1 tablet) after lunch, 4 mg (1 tablet) after supper, and 8 mg (2 tablets) at bedtime. Day 3: Take 4 mg (1 tablet) before breakfast, 4 mg (1 tablet) after lunch, 4 mg (1 tablet) after supper, and 4 mg (1 tablet) at bedtime. Day 4:  Take 4 mg (1 tablet) before breakfast, 4 mg (1 tablet) after lunch, and 4 mg (1 tablet) at bedtime. Day 5: Take 4 mg (1 tablet) before breakfast and 4 mg (1 tablet) at bedtime. Day 6: Take 4 mg (1 tablet) before breakfast.  Dispense: 1 each; Refill: 0 - benzonatate (TESSALON) 100 MG capsule; Take 1 capsule (100 mg total) by mouth 2 (two) times daily as needed for cough.  Dispense: 20 capsule; Refill: 0 - fluticasone (FLONASE) 50 MCG/ACT nasal spray; Place 2 sprays into both nostrils daily.  Dispense: 16 g; Refill: 6 - cetirizine (ZYRTEC) 10 MG tablet; Take 1 tablet (10 mg total) by mouth daily.  Dispense: 30 tablet; Refill: 11  3. Chronic otitis externa of both ears, unspecified type: Ears itchy, red and bleeding in ear canals bilaterally. Start oral anti-histamine but ear drops sent to decrease inflammation/prevent infection.   - NEOMYCIN-POLYMYXIN-HYDROCORTISONE (CORTISPORIN) 1 % SOLN OTIC solution; Place 3 drops into both ears 4 (four) times daily.  Dispense: 10 mL; Refill: 0  4. Weak urinary stream/Prostate cancer screening: He will follow up for annual physical but PSA ordered today. May need Urology referral.  - PSA  5. Screening for lipid disorders/Need for hepatitis C screening test/Screening for HIV (human immunodeficiency virus): Lipid panel, screening for HIV and Hep C ordered today as well with above labs.   - Lipid Profile - Hepatitis C Antibody - HIV antibody (with reflex)  6. Need for influenza vaccination: Flu vaccine administered today.  - Flu Vaccine QUAD 6+  mos PF IM (Fluarix Quad PF)   Follow-up: Return in about 3 months (around 07/19/2021).   Teodora Medici, DO

## 2021-04-21 LAB — COMPLETE METABOLIC PANEL WITH GFR
AG Ratio: 1.5 (calc) (ref 1.0–2.5)
ALT: 18 U/L (ref 9–46)
AST: 19 U/L (ref 10–40)
Albumin: 4.3 g/dL (ref 3.6–5.1)
Alkaline phosphatase (APISO): 66 U/L (ref 36–130)
BUN: 12 mg/dL (ref 7–25)
CO2: 30 mmol/L (ref 20–32)
Calcium: 9.5 mg/dL (ref 8.6–10.3)
Chloride: 105 mmol/L (ref 98–110)
Creat: 0.87 mg/dL (ref 0.60–1.29)
Globulin: 2.8 g/dL (calc) (ref 1.9–3.7)
Glucose, Bld: 83 mg/dL (ref 65–99)
Potassium: 4.3 mmol/L (ref 3.5–5.3)
Sodium: 141 mmol/L (ref 135–146)
Total Bilirubin: 0.8 mg/dL (ref 0.2–1.2)
Total Protein: 7.1 g/dL (ref 6.1–8.1)
eGFR: 107 mL/min/{1.73_m2} (ref >=60)

## 2021-04-21 LAB — CBC WITH DIFFERENTIAL/PLATELET
Absolute Monocytes: 720 cells/uL (ref 200–950)
Basophils Absolute: 59 cells/uL (ref 0–200)
Basophils Relative: 1 %
Eosinophils Absolute: 171 cells/uL (ref 15–500)
Eosinophils Relative: 2.9 %
HCT: 46.9 % (ref 38.5–50.0)
Hemoglobin: 16.1 g/dL (ref 13.2–17.1)
Lymphs Abs: 1805 cells/uL (ref 850–3900)
MCH: 30.1 pg (ref 27.0–33.0)
MCHC: 34.3 g/dL (ref 32.0–36.0)
MCV: 87.7 fL (ref 80.0–100.0)
MPV: 11.8 fL (ref 7.5–12.5)
Monocytes Relative: 12.2 %
Neutro Abs: 3145 cells/uL (ref 1500–7800)
Neutrophils Relative %: 53.3 %
Platelets: 190 10*3/uL (ref 140–400)
RBC: 5.35 10*6/uL (ref 4.20–5.80)
RDW: 12.5 % (ref 11.0–15.0)
Total Lymphocyte: 30.6 %
WBC: 5.9 10*3/uL (ref 3.8–10.8)

## 2021-04-21 LAB — PSA: PSA: 0.86 ng/mL (ref <=4.00)

## 2021-04-21 LAB — LIPID PANEL
Cholesterol: 134 mg/dL (ref <200)
HDL: 46 mg/dL (ref >=40)
LDL Cholesterol (Calc): 72 mg/dL (calc)
Non-HDL Cholesterol (Calc): 88 mg/dL (calc) (ref <130)
Total CHOL/HDL Ratio: 2.9 (calc) (ref <5.0)
Triglycerides: 77 mg/dL (ref <150)

## 2021-04-21 LAB — HIV ANTIBODY (ROUTINE TESTING W REFLEX): HIV 1&2 Ab, 4th Generation: NONREACTIVE

## 2021-04-21 LAB — HEPATITIS C ANTIBODY
Hepatitis C Ab: NONREACTIVE
SIGNAL TO CUT-OFF: 0.07 (ref <1.00)

## 2021-04-21 LAB — TSH: TSH: 1.29 mIU/L (ref 0.40–4.50)

## 2021-04-21 LAB — B12 AND FOLATE PANEL
Folate: 18.2 ng/mL
Vitamin B-12: 368 pg/mL (ref 200–1100)

## 2021-04-21 NOTE — Addendum Note (Signed)
Addended by: Margarita Mail on: 04/21/2021 10:39 AM   Modules accepted: Orders

## 2021-04-27 ENCOUNTER — Ambulatory Visit: Payer: Self-pay

## 2021-04-27 DIAGNOSIS — J0100 Acute maxillary sinusitis, unspecified: Secondary | ICD-10-CM

## 2021-04-27 NOTE — Telephone Encounter (Signed)
Patient called still has cough,  congestion  and thick like peanut butter phlegm and nose really dry. He has has been taking prednisone, doesn't seem to help much. Please call back with more help.   Chief Complaint: Cough Symptoms: Cough with thick green mucus, sinus headache Frequency: Started before Thanksgiving Pertinent Negatives: Patient denies fever Disposition: [] ED /[] Urgent Care (no appt availability in office) / [] Appointment(In office/virtual)/ []  [facility]/ [] Home Care/ [] Refused Recommended Disposition  Additional Notes: Pt. Reports initially he felt better with the steroids, "but then by Christmas this mucus, cough and sinus congestion won't go away." Please advise pt.    Answer Assessment - Initial Assessment Questions 1. ONSET: "When did the cough begin?"      03/17/21 2. SEVERITY: "How bad is the cough today?"      Moderate 3. SPUTUM: "Describe the color of your sputum" (none, dry cough; clear, white, yellow, green)     Thick green 4. HEMOPTYSIS: "Are you coughing up any blood?" If so ask: "How much?" (flecks, streaks, tablespoons, etc.)     No 5. DIFFICULTY BREATHING: "Are you having difficulty breathing?" If Yes, ask: "How bad is it?" (e.g., mild, moderate, severe)    - MILD: No SOB at rest, mild SOB with walking, speaks normally in sentences, can lie down, no retractions, pulse < 100.    - MODERATE: SOB at rest, SOB with minimal exertion and prefers to sit, cannot lie down flat, speaks in phrases, mild retractions, audible wheezing, pulse 100-120.    - SEVERE: Very SOB at rest, speaks in single words, struggling to breathe, sitting hunched forward, retractions, pulse > 120      No 6. FEVER: "Do you have a fever?" If Yes, ask: "What is your temperature, how was it measured, and when did it start?"     No 7. CARDIAC HISTORY: "Do you have any history of heart disease?" (e.g., heart attack, congestive heart failure)      Bradycardia 8. LUNG HISTORY: "Do you  have any history of lung disease?"  (e.g., pulmonary embolus, asthma, emphysema)     No 9. PE RISK FACTORS: "Do you have a history of blood clots?" (or: recent major surgery, recent prolonged travel, bedridden)     No 10. OTHER SYMPTOMS: "Do you have any other symptoms?" (e.g., runny nose, wheezing, chest pain)       Sinus headache 11. PREGNANCY: "Is there any chance you are pregnant?" "When was your last menstrual period?"       No 12. TRAVEL: "Have you traveled out of the country in the last month?" (e.g., travel history, exposures)       No  Protocols used: Cough - Acute Productive-A-AH

## 2021-04-27 NOTE — Progress Notes (Signed)
No show

## 2021-04-28 ENCOUNTER — Telehealth: Payer: Self-pay

## 2021-04-28 ENCOUNTER — Telehealth (INDEPENDENT_AMBULATORY_CARE_PROVIDER_SITE_OTHER): Payer: No Typology Code available for payment source | Admitting: Family Medicine

## 2021-04-28 ENCOUNTER — Encounter: Payer: Self-pay | Admitting: Family Medicine

## 2021-04-28 VITALS — Ht 70.0 in | Wt 158.0 lb

## 2021-04-28 DIAGNOSIS — Z91199 Patient's noncompliance with other medical treatment and regimen due to unspecified reason: Secondary | ICD-10-CM

## 2021-04-28 DIAGNOSIS — R058 Other specified cough: Secondary | ICD-10-CM

## 2021-04-28 MED ORDER — AMOXICILLIN-POT CLAVULANATE 875-125 MG PO TABS: 1.0000 | ORAL_TABLET | Freq: Two times a day (BID) | ORAL | 0 refills | Status: AC

## 2021-04-28 NOTE — Addendum Note (Signed)
Addended by: Margarita Mail on: 04/28/2021 09:09 AM   Modules accepted: Orders

## 2021-04-28 NOTE — Telephone Encounter (Signed)
Called patient he will call us back when he is ready to schedule has a lot going on right now

## 2021-04-28 NOTE — Telephone Encounter (Signed)
Antibiotics sent due to duration of symptoms.

## 2021-05-05 ENCOUNTER — Encounter: Payer: Self-pay | Admitting: *Deleted

## 2021-07-20 ENCOUNTER — Ambulatory Visit: Payer: No Typology Code available for payment source | Admitting: Internal Medicine

## 2021-08-29 ENCOUNTER — Encounter: Payer: Self-pay | Admitting: Family Medicine

## 2021-08-29 DIAGNOSIS — R001 Bradycardia, unspecified: Secondary | ICD-10-CM

## 2021-09-30 ENCOUNTER — Ambulatory Visit: Payer: Managed Care, Other (non HMO) | Admitting: Cardiology

## 2021-11-08 ENCOUNTER — Ambulatory Visit: Payer: BC Managed Care – PPO | Admitting: Cardiology

## 2021-11-08 ENCOUNTER — Ambulatory Visit (INDEPENDENT_AMBULATORY_CARE_PROVIDER_SITE_OTHER): Payer: BC Managed Care – PPO

## 2021-11-08 ENCOUNTER — Encounter: Payer: Self-pay | Admitting: Cardiology

## 2021-11-08 VITALS — BP 153/91 | HR 45 | Ht 70.0 in | Wt 153.2 lb

## 2021-11-08 DIAGNOSIS — R001 Bradycardia, unspecified: Secondary | ICD-10-CM | POA: Diagnosis not present

## 2021-11-08 DIAGNOSIS — R030 Elevated blood-pressure reading, without diagnosis of hypertension: Secondary | ICD-10-CM | POA: Diagnosis not present

## 2021-11-08 DIAGNOSIS — R55 Syncope and collapse: Secondary | ICD-10-CM | POA: Diagnosis not present

## 2021-11-08 DIAGNOSIS — R0683 Snoring: Secondary | ICD-10-CM

## 2021-11-08 NOTE — Patient Instructions (Signed)
Medication Instructions:   Your physician recommends that you continue on your current medications as directed. Please refer to the Current Medication list given to you today.  *If you need a refill on your cardiac medications before your next appointment, please call your pharmacy*   Testing/Procedures:  Your physician has requested that you have an echocardiogram. Echocardiography is a painless test that uses sound waves to create images of your heart. It provides your doctor with information about the size and shape of your heart and how well your heart's chambers and valves are working. This procedure takes approximately one hour. There are no restrictions for this procedure.  2.    Your physician has recommended that you wear a Zio XT monitor for 2 weeks. This will be mailed to your home address in 4-5 business days.   Your clinician has requested a Zio heart rhythm monitor by [employer] to be mailed to your home for you to wear for 14 days. You should expect a small box to arrive via USPS (or FedEx in some cases) within this next week. If you do not receive it please call [employer] at [phone].  Closely watching your heart at this time will help your care team understand more and provide information needed to develop your plan of care.  Please apply your Zio patch monitor the day you receive it. Keep this packaging, you will use this to return your Zio monitor.  You will easily be able to apply the monitor with the instructions provided in the Patient Guide.  If you need assistance, [employer] representatives are available 24/7 at [phone].  You can also download the [facility] app on your phone to view detailed application instructions and log symptoms.  After you wear your monitor for 14 days, place it back in the blue box or envelope, along with your Symptom Log.  To send your monitor back: Simply use the pre-addressed and pre-paid box/envelope.  Send it back through [employer]  the same day you remove it via your local post office or by placing it in your mailbox.  As soon as we receive the results, they will be reviewed and your clinician will contact you.  For the first 24 hours- it is essential to not shower or exercise, to allow the patch to adhere to your skin. Avoid excessive sweating to help maximize wear time. Do not submerge the device, no hot tubs, and no swimming pools. Keep any lotions or oils away from the patch. After 24 hours you may shower with the patch on. Take brief showers with your back facing the shower head.  Do not remove patch once it has been placed because that will interrupt data and decrease adhesive wear time. Push the button when you have any symptoms and write down what you were feeling. Once you have completed wearing your monitor, remove and place into box which has postage paid and place in your outgoing mailbox.  If for some reason you have misplaced your box then call our office and we can provide another box and/or mail it off for you.    Follow-Up: At [facility], you and your health needs are our priority.  As part of our continuing mission to provide you with exceptional heart care, we have created designated Provider Care Teams.  These Care Teams include your primary Cardiologist (physician) and Advanced Practice Providers (APPs -  Physician Assistants and Nurse Practitioners) who all work together to provide you with the care you need, when  you need it.  We recommend signing up for the patient portal called "MyChart".  Sign up information is provided on this After Visit Summary.  MyChart is used to connect with patients for Virtual Visits (Telemedicine).  Patients are able to view lab/test results, encounter notes, upcoming appointments, etc.  Non-urgent messages can be sent to your provider as well.   To learn more about what you can do with MyChart, go to [email or web address].    Your next appointment:   6-8  week(s)  The format for your next appointment:   In Person  Provider:   Debbe Odea, MD    Other Instructions   Important Information About Sugar

## 2021-11-08 NOTE — Progress Notes (Signed)
Cardiology Office Note:    Date:  11/08/2021   ID:  Marc Dominguez, DOB 1976/01/03, MRN [id number]  PCP:  Margarita Mail, DO   [facility] Providers Cardiologist:  None     Referring MD: Margarita Mail, DO   Chief Complaint  Patient presents with   New Patient    Bradycardia / Dizziness, Light-headedness, Diaphoreses / Syncope   Marc Dominguez is a 46 y.o. male who is being seen today for the evaluation of bradycardia at the request of Margarita Mail, DO.   History of Present Illness:    Marc Dominguez is a 46 y.o. male with a hx of sinus bradycardia who presents due to bradycardia and syncope.  He states having a history of low heart rates for years, over the past year or so, he has passed out at least 2 times.  He had a pulse oximetry device which he placed on his fingers the last 2 times he passed out, heart rates noted to be in the 20s to 30s.  He states knowing when he is about to pass out, usually gets dizzy, sweaty, which prompted him to check his heart rate and is usually low.  Denies any history of heart disease.  His blood pressure is always well controlled.  Denies palpitations, chest pain.  Endorses shortness of breath when he lays on his back, also endorses snoring.  States sleeping for about 4 hours nightly due to having racing thoughts.    Past Medical History:  Diagnosis Date   Bradycardia     Past Surgical History:  Procedure Laterality Date   CERVICAL SPINE SURGERY      Current Medications: No outpatient medications have been marked as taking for the 11/08/21 encounter (Office Visit) with Debbe Odea, MD.     Allergies:   Patient has no known allergies.   Social History   Socioeconomic History   Marital status: Married    Spouse name: Not on file   Number of children: Not on file   Years of education: Not on file   Highest education level: Not on file  Occupational History   Not on file  Tobacco Use   Smoking status: Never    Smokeless tobacco: Current    Types: Chew  Vaping Use   Vaping Use: Never used  Substance and Sexual Activity   Alcohol use: Yes   Drug use: No   Sexual activity: Yes  Other Topics Concern   Not on file  Social History Narrative   Not on file   Social Determinants of Health   Financial Resource Strain: Not on file  Food Insecurity: Not on file  Transportation Needs: Not on file  Physical Activity: Not on file  Stress: Not on file  Social Connections: Not on file     Family History: The patient's family history includes Alcohol abuse in his brother; Cancer in his father; Diabetes in his mother; Hyperlipidemia in his brother and mother; Hypertension in his brother, mother, and sister.  ROS:   Please see the history of present illness.     All other systems reviewed and are negative.  EKGs/Labs/Other Studies Reviewed:    The following studies were reviewed today:   EKG:  EKG is  ordered today.  The ekg ordered today demonstrates sinus bradycardia, heart rate 45  Recent Labs: 04/20/2021: ALT 18; BUN 12; Creat 0.87; Hemoglobin 16.1; Platelets 190; Potassium 4.3; Sodium 141; TSH 1.29  Recent Lipid Panel    Component  Value Date/Time   CHOL 134 04/20/2021 1014   TRIG 77 04/20/2021 1014   HDL 46 04/20/2021 1014   CHOLHDL 2.9 04/20/2021 1014   LDLCALC 72 04/20/2021 1014     Risk Assessment/Calculations:         Physical Exam:    VS:  BP (!) 153/91 (BP Location: Left Arm, Patient Position: Sitting, Cuff Size: Normal)   Pulse (!) 45   Ht 5\' 10"  (1.778 m)   Wt 153 lb 3.2 oz (69.5 kg)   SpO2 96%   BMI 21.98 kg/m     Wt Readings from Last 3 Encounters:  11/08/21 153 lb 3.2 oz (69.5 kg)  04/28/21 158 lb (71.7 kg)  04/20/21 158 lb 11.2 oz (72 kg)     GEN:  Well nourished, well developed in no acute distress HEENT: Normal NECK: No JVD; No carotid bruits CARDIAC: Bradycardic, regular, no murmurs RESPIRATORY:  Clear to auscultation without rales, wheezing or  rhonchi  ABDOMEN: Soft, non-tender, non-distended MUSCULOSKELETAL:  No edema; No deformity  SKIN: Warm and dry NEUROLOGIC:  Alert and oriented x 3 PSYCHIATRIC:  Normal affect   ASSESSMENT:    1. Symptomatic bradycardia   2. Syncope and collapse   3. Elevated BP without diagnosis of hypertension   4. Snoring    PLAN:    In order of problems listed above:  Symptomatic bradycardia, heart rates as low as 20s recorded on device, associated with syncope.  Get cardiac monitor to evaluate high degree AV block, get echocardiogram.  Refer to EP. Syncope, cardiac monitor and echo as above. BP elevated today, usually controlled, continue to monitor. Snoring, bradycardia, sleep study recommended, patient would like to hold off for now, pending cardiac work-up.      Follow-up after Cardiac monitor and echocardiogram  Medication Adjustments/Labs and Tests Ordered: Current medicines are reviewed at length with the patient today.  Concerns regarding medicines are outlined above.  Orders Placed This Encounter  Procedures   Ambulatory referral to Cardiac Electrophysiology   LONG TERM MONITOR (3-14 DAYS)   EKG 12-Lead   ECHOCARDIOGRAM COMPLETE   No orders of the defined types were placed in this encounter.   Patient Instructions  Medication Instructions:   Your physician recommends that you continue on your current medications as directed. Please refer to the Current Medication list given to you today.  *If you need a refill on your cardiac medications before your next appointment, please call your pharmacy*   Testing/Procedures:  Your physician has requested that you have an echocardiogram. Echocardiography is a painless test that uses sound waves to create images of your heart. It provides your doctor with information about the size and shape of your heart and how well your heart's chambers and valves are working. This procedure takes approximately one hour. There are no restrictions  for this procedure.  2.    Your physician has recommended that you wear a Zio XT monitor for 2 weeks. This will be mailed to your home address in 4-5 business days.   Your clinician has requested a Zio heart rhythm monitor by [employer] to be mailed to your home for you to wear for 14 days. You should expect a small box to arrive via USPS (or FedEx in some cases) within this next week. If you do not receive it please call [employer] at [phone].  Closely watching your heart at this time will help your care team understand more and provide information needed to develop your plan of care.  Please apply your Zio patch monitor the day you receive it. Keep this packaging, you will use this to return your Zio monitor.  You will easily be able to apply the monitor with the instructions provided in the Patient Guide.  If you need assistance, [employer] representatives are available 24/7 at [phone].  You can also download the [facility] app on your phone to view detailed application instructions and log symptoms.  After you wear your monitor for 14 days, place it back in the blue box or envelope, along with your Symptom Log.  To send your monitor back: Simply use the pre-addressed and pre-paid box/envelope.  Send it back through [employer] the same day you remove it via your local post office or by placing it in your mailbox.  As soon as we receive the results, they will be reviewed and your clinician will contact you.  For the first 24 hours- it is essential to not shower or exercise, to allow the patch to adhere to your skin. Avoid excessive sweating to help maximize wear time. Do not submerge the device, no hot tubs, and no swimming pools. Keep any lotions or oils away from the patch. After 24 hours you may shower with the patch on. Take brief showers with your back facing the shower head.  Do not remove patch once it has been placed because that will interrupt data and decrease adhesive wear  time. Push the button when you have any symptoms and write down what you were feeling. Once you have completed wearing your monitor, remove and place into box which has postage paid and place in your outgoing mailbox.  If for some reason you have misplaced your box then call our office and we can provide another box and/or mail it off for you.    Follow-Up: At [facility], you and your health needs are our priority.  As part of our continuing mission to provide you with exceptional heart care, we have created designated Provider Care Teams.  These Care Teams include your primary Cardiologist (physician) and Advanced Practice Providers (APPs -  Physician Assistants and Nurse Practitioners) who all work together to provide you with the care you need, when you need it.  We recommend signing up for the patient portal called "MyChart".  Sign up information is provided on this After Visit Summary.  MyChart is used to connect with patients for Virtual Visits (Telemedicine).  Patients are able to view lab/test results, encounter notes, upcoming appointments, etc.  Non-urgent messages can be sent to your provider as well.   To learn more about what you can do with MyChart, go to [email or web address].    Your next appointment:   6-8 week(s)  The format for your next appointment:   In Person  Provider:   Debbe Odea, MD    Other Instructions   Important Information About Sugar         Signed, Debbe Odea, MD  11/08/2021 11:11 AM    [facility]

## 2021-11-10 DIAGNOSIS — R001 Bradycardia, unspecified: Secondary | ICD-10-CM

## 2021-11-10 DIAGNOSIS — R55 Syncope and collapse: Secondary | ICD-10-CM | POA: Diagnosis not present

## 2021-11-29 DIAGNOSIS — R55 Syncope and collapse: Secondary | ICD-10-CM | POA: Diagnosis not present

## 2021-11-29 DIAGNOSIS — R001 Bradycardia, unspecified: Secondary | ICD-10-CM | POA: Diagnosis not present

## 2021-12-02 ENCOUNTER — Ambulatory Visit (INDEPENDENT_AMBULATORY_CARE_PROVIDER_SITE_OTHER): Payer: BC Managed Care – PPO

## 2021-12-02 DIAGNOSIS — R001 Bradycardia, unspecified: Secondary | ICD-10-CM

## 2021-12-02 DIAGNOSIS — R55 Syncope and collapse: Secondary | ICD-10-CM | POA: Diagnosis not present

## 2021-12-05 LAB — ECHOCARDIOGRAM COMPLETE
AR max vel: 2.38 cm2
AV Area VTI: 2.11 cm2
AV Area mean vel: 2.23 cm2
AV Mean grad: 3 mmHg
AV Peak grad: 5.2 mmHg
Ao pk vel: 1.14 m/s
Area-P 1/2: 2.02 cm2
Calc EF: 40.7 %
S' Lateral: 3.4 cm
Single Plane A2C EF: 40.3 %
Single Plane A4C EF: 40.8 %

## 2021-12-08 ENCOUNTER — Telehealth: Payer: Self-pay | Admitting: Emergency Medicine

## 2021-12-08 NOTE — Telephone Encounter (Signed)
Patient is returning call.  °

## 2021-12-08 NOTE — Telephone Encounter (Signed)
-----   Message from Debbe Odea, MD sent at 12/02/2021  2:53 PM EDT ----- Echo shows mildly reduced ejection fraction, keep follow-up appointment, will consider medication for reduced EF and additional work-up at follow-up visit.

## 2021-12-08 NOTE — Telephone Encounter (Signed)
Monitor Results  Occasional nonsustained VT, frequent PVCs associated with patient triggered events noted.  Minimum heart rate of 30 likely occured when patient was asleep.  No findings of high degree AV block noted.   Called patient to go over results. No answer, lmtcb

## 2021-12-08 NOTE — Telephone Encounter (Signed)
Called and spoke with patient. Results reviewed with patient, pt verbalized understanding,  questions (if any) answered.   ?

## 2021-12-30 ENCOUNTER — Ambulatory Visit: Payer: BC Managed Care – PPO | Attending: Cardiology | Admitting: Cardiology

## 2021-12-30 ENCOUNTER — Encounter: Payer: Self-pay | Admitting: Cardiology

## 2021-12-30 VITALS — BP 124/66 | HR 48 | Ht 70.0 in | Wt 151.0 lb

## 2021-12-30 DIAGNOSIS — R9430 Abnormal result of cardiovascular function study, unspecified: Secondary | ICD-10-CM

## 2021-12-30 DIAGNOSIS — I429 Cardiomyopathy, unspecified: Secondary | ICD-10-CM | POA: Diagnosis not present

## 2021-12-30 DIAGNOSIS — R001 Bradycardia, unspecified: Secondary | ICD-10-CM

## 2021-12-30 NOTE — Progress Notes (Signed)
Cardiology Office Note:    Date:  12/30/2021   ID:  Marc L Pippins, DOB [birth year], MRN [id number]  PCP:  Margarita Mail, DO   [facility] Providers Cardiologist:  None     Referring MD: Margarita Mail, DO   Chief Complaint  Patient presents with   Other    Follow up post Echo and Zio. Meds reviewed verbally with patient.      History of Present Illness:    Marc Dominguez is a 46 y.o. male with a hx of sinus bradycardia who presents for follow-up.  Previously seen due to bradycardia and syncope.    Due to low heart rates at baseline, cardiac monitor placed to evaluate any high degree AV block.  Symptoms of syncope occur randomly, usually with prodromal symptoms such as diaphoresis and dizziness.  Endorses some shortness of breath.  Echocardiogram also obtained to evaluate any structural abnormalities.  His father had CABG age 75s.  Dad was a smoker.  Presents for results, still has dizziness usually with changing positions such as bending to standing.   Past Medical History:  Diagnosis Date   Bradycardia     Past Surgical History:  Procedure Laterality Date   CERVICAL SPINE SURGERY      Current Medications: No outpatient medications have been marked as taking for the 12/30/21 encounter (Office Visit) with Debbe Odea, MD.     Allergies:   Patient has no known allergies.   Social History   Socioeconomic History   Marital status: Married    Spouse name: Not on file   Number of children: Not on file   Years of education: Not on file   Highest education level: Not on file  Occupational History   Not on file  Tobacco Use   Smoking status: Never   Smokeless tobacco: Current    Types: Chew  Vaping Use   Vaping Use: Never used  Substance and Sexual Activity   Alcohol use: Yes   Drug use: No   Sexual activity: Yes  Other Topics Concern   Not on file  Social History Narrative   Not on file   Social Determinants of Health   Financial  Resource Strain: Not on file  Food Insecurity: Not on file  Transportation Needs: Not on file  Physical Activity: Not on file  Stress: Not on file  Social Connections: Not on file     Family History: The patient's family history includes Alcohol abuse in his brother; Cancer in his father; Diabetes in his mother; Hyperlipidemia in his brother and mother; Hypertension in his brother, mother, and sister.  ROS:   Please see the history of present illness.     All other systems reviewed and are negative.  EKGs/Labs/Other Studies Reviewed:    The following studies were reviewed today:   EKG:  EKG is  ordered today.  The ekg ordered today demonstrates sinus bradycardia, heart rate 45  Recent Labs: 04/20/2021: ALT 18; BUN 12; Creat 0.87; Hemoglobin 16.1; Platelets 190; Potassium 4.3; Sodium 141; TSH 1.29  Recent Lipid Panel    Component Value Date/Time   CHOL 134 04/20/2021 1014   TRIG 77 04/20/2021 1014   HDL 46 04/20/2021 1014   CHOLHDL 2.9 04/20/2021 1014   LDLCALC 72 04/20/2021 1014     Risk Assessment/Calculations:         Physical Exam:    VS:  BP 124/66 (BP Location: Left Arm, Patient Position: Sitting, Cuff Size: Normal)   Pulse Marland Kitchen)  48   Ht 5\' 10"  (1.778 m)   Wt 151 lb (68.5 kg)   SpO2 98%   BMI 21.67 kg/m     Wt Readings from Last 3 Encounters:  12/30/21 151 lb (68.5 kg)  11/08/21 153 lb 3.2 oz (69.5 kg)  04/28/21 158 lb (71.7 kg)     GEN:  Well nourished, well developed in no acute distress HEENT: Normal NECK: No JVD; No carotid bruits CARDIAC: Bradycardic, regular, no murmurs RESPIRATORY:  Clear to auscultation without rales, wheezing or rhonchi  ABDOMEN: Soft, non-tender, non-distended MUSCULOSKELETAL:  No edema; No deformity  SKIN: Warm and dry NEUROLOGIC:  Alert and oriented x 3 PSYCHIATRIC:  Normal affect   ASSESSMENT:    1. Symptomatic bradycardia   2. Ejection fraction < 50%   3. Cardiomyopathy, unspecified type (HCC)    PLAN:    In  order of problems listed above:  Symptomatic bradycardia, heart rates as low as 20s recorded on device, associated with syncope.  Cardiac monitor showed occasional nonsustained VT, frequent PVCs, average heart rate 58, minimal 30s while patient was asleep, no evidence of high degree AV block.  Echo with mildly reduced EF 45 %.  Patient describes prodromal symptoms, also positional symptoms suggesting both vasovagal and vertigo as possible etiologies.  Keep appointment with EP for any additional input. LVEF 40 to 45%.  Get coronary CTA to evaluate CAD.     Follow-up in 6 to 8 weeks  Medication Adjustments/Labs and Tests Ordered: Current medicines are reviewed at length with the patient today.  Concerns regarding medicines are outlined above.  Orders Placed This Encounter  Procedures   CT CORONARY MORPH W/CTA COR W/SCORE W/CA W/CM &/OR WO/CM   Basic metabolic panel   No orders of the defined types were placed in this encounter.   Patient Instructions  Medication Instructions:  Your physician recommends that you continue on your current medications as directed. Please refer to the Current Medication list given to you today.  *If you need a refill on your cardiac medications before your next appointment, please call your pharmacy*   Lab Work:  Please go to the [facility] for a lab [facility]) draw after your appointment today.   Testing/Procedures:  Your physician has requested that you have cardiac CT. Cardiac computed tomography (CT) is a painless test that uses an x-ray machine to take clear, detailed pictures of your heart.    Your cardiac CT will be scheduled at:  [facility] [address] [place], [place] [phone]  Please arrive 15 mins early for check-in and test prep.    Please follow these instructions carefully (unless otherwise directed):    On the Night Before the Test: Be sure to Drink plenty of  water. Do not consume any caffeinated/decaffeinated beverages or chocolate 12 hours prior to your test.   On the Day of the Test: Drink plenty of water until 1 hour prior to the test. Do not eat any food 4 hours prior to the test. You may take your regular medications prior to the test.        After the Test: Drink plenty of water. After receiving IV contrast, you may experience a mild flushed feeling. This is normal. On occasion, you may experience a mild rash up to 24 hours after the test. This is not dangerous. If this occurs, you can take Benadryl 25 mg and increase your fluid intake. If you experience trouble breathing, this can  be serious. If it is severe call 911 IMMEDIATELY. If it is mild, please call our office. If you take any of these medications: Glipizide/Metformin, Avandament, Glucavance, please do not take 48 hours after completing test unless otherwise instructed.  Please allow 2-4 weeks for scheduling of routine cardiac CTs. Some insurance companies require a pre-authorization which may delay scheduling of this test.   For non-scheduling related questions, please contact the cardiac imaging nurse navigator should you have any questions/concerns: Rockwell Alexandria, Cardiac Imaging Nurse Navigator Larey Brick, Cardiac Imaging Nurse Navigator  Heart [facility] Services Direct Office Dial: [phone]   For scheduling needs, including cancellations and rescheduling, please call [place], [phone].     Follow-Up: At [facility], you and your health needs are our priority.  As part of our continuing mission to provide you with exceptional heart care, we have created designated Provider Care Teams.  These Care Teams include your primary Cardiologist (physician) and Advanced Practice Providers (APPs -  Physician Assistants and Nurse Practitioners) who all work together to provide you with the care you need, when you need it.  We recommend signing up  for the patient portal called "MyChart".  Sign up information is provided on this After Visit Summary.  MyChart is used to connect with patients for Virtual Visits (Telemedicine).  Patients are able to view lab/test results, encounter notes, upcoming appointments, etc.  Non-urgent messages can be sent to your provider as well.   To learn more about what you can do with MyChart, go to [email or web address].    Your next appointment:   6-8 week(s)  The format for your next appointment:   In Person  Provider:    ONLY WITH Debbe Odea, MD       Important Information About Sugar         Signed, Debbe Odea, MD  12/30/2021 9:38 AM    [facility]

## 2021-12-30 NOTE — Patient Instructions (Signed)
Medication Instructions:  Your physician recommends that you continue on your current medications as directed. Please refer to the Current Medication list given to you today.  *If you need a refill on your cardiac medications before your next appointment, please call your pharmacy*   Lab Work:  Please go to the [facility] for a lab [facility]) draw after your appointment today.   Testing/Procedures:  Your physician has requested that you have cardiac CT. Cardiac computed tomography (CT) is a painless test that uses an x-ray machine to take clear, detailed pictures of your heart.    Your cardiac CT will be scheduled at:  [facility] [address] [place], [place] [address] [phone]  Please arrive 15 mins early for check-in and test prep.    Please follow these instructions carefully (unless otherwise directed):    On the Night Before the Test: Be sure to Drink plenty of water. Do not consume any caffeinated/decaffeinated beverages or chocolate 12 hours prior to your test.   On the Day of the Test: Drink plenty of water until 1 hour prior to the test. Do not eat any food 4 hours prior to the test. You may take your regular medications prior to the test.        After the Test: Drink plenty of water. After receiving IV contrast, you may experience a mild flushed feeling. This is normal. On occasion, you may experience a mild rash up to 24 hours after the test. This is not dangerous. If this occurs, you can take Benadryl 25 mg and increase your fluid intake. If you experience trouble breathing, this can be serious. If it is severe call 911 IMMEDIATELY. If it is mild, please call our office. If you take any of these medications: Glipizide/Metformin, Avandament, Glucavance, please do not take 48 hours after completing test unless otherwise instructed.  Please allow 2-4 weeks for scheduling of routine cardiac CTs. Some insurance  companies require a pre-authorization which may delay scheduling of this test.   For non-scheduling related questions, please contact the cardiac imaging nurse navigator should you have any questions/concerns: Rockwell Alexandria, Cardiac Imaging Nurse Navigator Larey Brick, Cardiac Imaging Nurse Navigator [facility] Direct Office Dial: [phone]   For scheduling needs, including cancellations and rescheduling, please call [place], [phone].     Follow-Up: At [facility], you and your health needs are our priority.  As part of our continuing mission to provide you with exceptional heart care, we have created designated Provider Care Teams.  These Care Teams include your primary Cardiologist (physician) and Advanced Practice Providers (APPs -  Physician Assistants and Nurse Practitioners) who all work together to provide you with the care you need, when you need it.  We recommend signing up for the patient portal called "MyChart".  Sign up information is provided on this After Visit Summary.  MyChart is used to connect with patients for Virtual Visits (Telemedicine).  Patients are able to view lab/test results, encounter notes, upcoming appointments, etc.  Non-urgent messages can be sent to your provider as well.   To learn more about what you can do with MyChart, go to [email or web address].    Your next appointment:   6-8 week(s)  The format for your next appointment:   In Person  Provider:    ONLY WITH Debbe Odea, MD       Important Information About Sugar

## 2022-01-10 NOTE — Progress Notes (Unsigned)
Electrophysiology Office Note:    Date:  01/11/2022   ID:  Marc L Javed, DOB Apr 23, 1976, MRN [id number]  PCP:  Margarita Mail, DO  [facility] Cardiologist:  None  [facility] Electrophysiologist:  Lanier Prude, MD   Referring MD: Debbe Odea, MD   Chief Complaint: syncope and bradycardia  History of Present Illness:    Marc Dominguez is a 46 y.o. male who presents for an evaluation of syncope and bradycardia at the request of Dr Gena Fray. Their medical history includes syncope. Patient has reported multiple episodes of syncope. No clear triggers. Usually have prodrome of diaphoresis and dizziness. He wore a monitor which showed HR down to 20 bpm. Echo showed EF 45%.  Today he describes his syncopal episodes in detail.  He has had at least 5 the date back several years.  Each episode is preceded by several seconds of predictable symptoms.  The symptoms include a jitteriness or feeling of low blood sugar.  This is followed by intense sweating.  There is sometimes GI symptoms.  If he does not immediately sit down during the symptoms he will lose consciousness within 10 seconds.  He has had episodes that have resulted in personal injury including a fall down the stairs where he injured his face.  It is unclear whether or not these episodes are associated with bradycardia although he does have a resting bradycardia in the upper 30s and low 40s.  He has previously worn a heart monitor which showed normal chronotropic response and NSVT.  He is currently undergoing a work-up for a reduced ejection fraction and has a CTA planned for tomorrow.  He works in Sales promotion account executive.  He does not stay as hydrated as he thinks he probably should.  Physical exertion does not bring out the symptoms.      Past Medical History:  Diagnosis Date   Bradycardia     Past Surgical History:  Procedure Laterality Date   CERVICAL SPINE SURGERY      Current Medications: No outpatient medications have  been marked as taking for the 01/11/22 encounter (Office Visit) with Lanier Prude, MD.     Allergies:   Patient has no known allergies.   Social History   Socioeconomic History   Marital status: Married    Spouse name: Not on file   Number of children: Not on file   Years of education: Not on file   Highest education level: Not on file  Occupational History   Not on file  Tobacco Use   Smoking status: Never   Smokeless tobacco: Current    Types: Chew  Vaping Use   Vaping Use: Never used  Substance and Sexual Activity   Alcohol use: Yes    Comment: 6-8 beers on the weekends   Drug use: No   Sexual activity: Yes  Other Topics Concern   Not on file  Social History Narrative   Not on file   Social Determinants of Health   Financial Resource Strain: Not on file  Food Insecurity: Not on file  Transportation Needs: Not on file  Physical Activity: Not on file  Stress: Not on file  Social Connections: Not on file     Family History: The patient's family history includes Alcohol abuse in his brother; Cancer in his father; Diabetes in his mother; Hyperlipidemia in his brother and mother; Hypertension in his brother, brother, brother, brother, mother, and sister.  ROS:   Please see the history of present illness.  All other systems reviewed and are negative.  EKGs/Labs/Other Studies Reviewed:    The following studies were reviewed today:  12/02/2021 Echo EF 40% RV normal LA dilated Mild MR Mod TR  12/01/2021 Zio HR 30 - 200 5 NSVT, longest 16 beats 28 SVT, longest 5 beats Rare supraventricular and ventricular ectopy    Recent Labs: 04/20/2021: ALT 18; BUN 12; Creat 0.87; Hemoglobin 16.1; Platelets 190; Potassium 4.3; Sodium 141; TSH 1.29  Recent Lipid Panel    Component Value Date/Time   CHOL 134 04/20/2021 1014   TRIG 77 04/20/2021 1014   HDL 46 04/20/2021 1014   CHOLHDL 2.9 04/20/2021 1014   LDLCALC 72 04/20/2021 1014    Physical Exam:    VS:   BP 114/78 (BP Location: Left Arm, Patient Position: Sitting, Cuff Size: Normal)   Pulse (!) 38   Ht 5\' 10"  (1.778 m)   Wt 156 lb (70.8 kg)   SpO2 99%   BMI 22.38 kg/m     Wt Readings from Last 3 Encounters:  01/11/22 156 lb (70.8 kg)  12/30/21 151 lb (68.5 kg)  11/08/21 153 lb 3.2 oz (69.5 kg)     GEN:  Well nourished, well developed in no acute distress HEENT: Normal NECK: No JVD; No carotid bruits LYMPHATICS: No lymphadenopathy CARDIAC: Regular rhythm, bradycardic, no murmurs, rubs, gallops RESPIRATORY:  Clear to auscultation without rales, wheezing or rhonchi  ABDOMEN: Soft, non-tender, non-distended MUSCULOSKELETAL:  No edema; No deformity  SKIN: Warm and dry NEUROLOGIC:  Alert and oriented x 3 PSYCHIATRIC:  Normal affect       ASSESSMENT:    1. Symptomatic bradycardia   2. Syncope and collapse    PLAN:    In order of problems listed above:  #Syncope Recurrent. Accompanied by prodromal symptoms of diaphoresis and dizziness.  ZIO monitor shows fairly good chronotropic.  I do not think his syncope is due to lack of chronotropic response.  I suspect his symptoms are due to to vasovagal syncope that is likely poorly tolerated because of his resting low heart rate.  This may be worsened by an element of dehydration and orthostasis.  I am concerned about his symptoms given his personal injury during a fall on the stairs during a syncopal episode.  I have advised him to avoid driving or operating heavy machinery.  The options at this point are to proceed directly to permanent pacemaker versus trying to better correlate his episodes with a worsened bradycardia.  Given his young age, we have mutually decided to proceed with loop recorder implant.  This will allow 01/09/22 to correlate symptoms of lightheadedness or episodes of syncope with worsened bradycardia or AV conduction.  He is also had NSVT on his ZIO monitor and the loop recorder will help [place] confirm that his episodes of  syncope/lightheadedness are not related to malignant tachycardias.  I discussed the loop recorder implant procedure in detail with the patient including the risks and associated monthly monitoring cost and he wishes to proceed.  I will follow the results of his CTA coronary scheduled for tomorrow in case those results change our plan.  I will plan to see him back next week for loop recorder implant.  Okay to overbook/double book.  Plan for Abbott loop recorder device.  Total time spent with patient today 60 minutes. This includes reviewing records, evaluating the patient and coordinating care.  Medication Adjustments/Labs and Tests Ordered: Current medicines are reviewed at length with the patient today.  Concerns regarding medicines  are outlined above.  No orders of the defined types were placed in this encounter.  No orders of the defined types were placed in this encounter.    Signed, Rossie Muskrat. Lalla Brothers, MD, [facility], [facility] 01/11/2022 9:26 AM    Electrophysiology [facility]

## 2022-01-11 ENCOUNTER — Other Ambulatory Visit
Admission: RE | Admit: 2022-01-11 | Discharge: 2022-01-11 | Disposition: A | Discharge status: Home / Self Care | Payer: BC Managed Care – PPO | Attending: Cardiology | Admitting: Cardiology

## 2022-01-11 ENCOUNTER — Encounter: Payer: Self-pay | Admitting: Cardiology

## 2022-01-11 ENCOUNTER — Telehealth (HOSPITAL_COMMUNITY): Payer: Self-pay | Admitting: *Deleted

## 2022-01-11 ENCOUNTER — Ambulatory Visit: Payer: BC Managed Care – PPO | Attending: Cardiology | Admitting: Cardiology

## 2022-01-11 VITALS — BP 114/78 | HR 38 | Ht 70.0 in | Wt 156.0 lb

## 2022-01-11 DIAGNOSIS — R001 Bradycardia, unspecified: Secondary | ICD-10-CM

## 2022-01-11 DIAGNOSIS — I429 Cardiomyopathy, unspecified: Secondary | ICD-10-CM | POA: Diagnosis not present

## 2022-01-11 DIAGNOSIS — R9430 Abnormal result of cardiovascular function study, unspecified: Secondary | ICD-10-CM | POA: Diagnosis not present

## 2022-01-11 DIAGNOSIS — R55 Syncope and collapse: Secondary | ICD-10-CM | POA: Diagnosis not present

## 2022-01-11 LAB — BASIC METABOLIC PANEL
Anion gap: 5 (ref 5–15)
BUN: 14 mg/dL (ref 6–20)
CO2: 28 mmol/L (ref 22–32)
Calcium: 9.4 mg/dL (ref 8.9–10.3)
Chloride: 107 mmol/L (ref 98–111)
Creatinine, Ser: 0.96 mg/dL (ref 0.61–1.24)
GFR, Estimated: >60 mL/min (ref >60)
Glucose, Bld: 101 mg/dL — ABNORMAL HIGH (ref 70–99)
Potassium: 4.5 mmol/L (ref 3.5–5.1)
Sodium: 140 mmol/L (ref 135–145)

## 2022-01-11 NOTE — Patient Instructions (Signed)
Medication Instructions:  none *If you need a refill on your cardiac medications before your next appointment, please call your pharmacy*   Lab Work: none If you have labs (blood work) drawn today and your tests are completely normal, you will receive your results only by: MyChart Message (if you have MyChart) OR A paper copy in the mail If you have any lab test that is abnormal or we need to change your treatment, we will call you to review the results.   Testing/Procedures: Loop recorder implant to monitor your syncope/bradycardia.   Follow-Up: At [facility], you and your health needs are our priority.  As part of our continuing mission to provide you with exceptional heart care, we have created designated Provider Care Teams.  These Care Teams include your primary Cardiologist (physician) and Advanced Practice Providers (APPs -  Physician Assistants and Nurse Practitioners) who all work together to provide you with the care you need, when you need it.  We recommend signing up for the patient portal called "MyChart".  Sign up information is provided on this After Visit Summary.  MyChart is used to connect with patients for Virtual Visits (Telemedicine).  Patients are able to view lab/test results, encounter notes, upcoming appointments, etc.  Non-urgent messages can be sent to your provider as well.   To learn more about what you can do with MyChart, go to [email or web address].    Your next appointment:   Loop recorder implant SJ device with Dr. Lalla Brothers.   The format for your next appointment:   In Person  Provider:   Steffanie Dunn, MD     Important Information About Sugar

## 2022-01-11 NOTE — Telephone Encounter (Signed)
Attempted to call patient regarding upcoming cardiac CT appointment. °Left message on voicemail with name and callback number ° °Jaxston Chohan RN Navigator Cardiac Imaging °[facility] °[phone] Office °[phone] Cell ° °

## 2022-01-12 ENCOUNTER — Ambulatory Visit
Admission: RE | Admit: 2022-01-12 | Discharge: 2022-01-12 | Disposition: A | Discharge status: Home / Self Care | Payer: BC Managed Care – PPO | Source: Ambulatory Visit | Attending: Cardiology | Admitting: Cardiology

## 2022-01-12 DIAGNOSIS — R001 Bradycardia, unspecified: Secondary | ICD-10-CM | POA: Insufficient documentation

## 2022-01-12 DIAGNOSIS — R9430 Abnormal result of cardiovascular function study, unspecified: Secondary | ICD-10-CM | POA: Insufficient documentation

## 2022-01-12 MED ORDER — NITROGLYCERIN 0.4 MG SL SUBL
0.8000 mg | SUBLINGUAL_TABLET | Freq: Once | SUBLINGUAL | Status: AC
  Administered 2022-01-12: 0.8 mg via SUBLINGUAL

## 2022-01-12 MED ORDER — IOHEXOL 350 MG/ML SOLN
75.0000 mL | Freq: Once | INTRAVENOUS | Status: AC | PRN
  Administered 2022-01-12: 75 mL via INTRAVENOUS

## 2022-01-12 NOTE — Progress Notes (Signed)
Patient tolerated procedure well. Ambulate w/o difficulty. Denies light headedness or being dizzy. Sitting in chair drinking water provided. Encouraged to drink extra water today and reasoning explained. Verbalized understanding. All questions answered. ABC intact. No further needs. Discharge from procedure area w/o issues.   °

## 2022-01-16 NOTE — Progress Notes (Unsigned)
Electrophysiology Office Follow up Visit Note:    Date:  01/18/2022   ID:  Marc Dominguez, DOB [birth year], MRN [id number]  PCP:  Teodora Medici, DO  [facility] Cardiologist:  None  [facility] Electrophysiologist:  Vickie Epley, MD    Interval History:    Marc Dominguez is a 46 y.o. male who presents for a follow up visit. They were last seen in clinic 01/11/2022 for syncope. He presents today for ILR implant.   From my last note: "#Syncope Recurrent. Accompanied by prodromal symptoms of diaphoresis and dizziness.  ZIO monitor shows fairly good chronotropic.  I do not think his syncope is due to lack of chronotropic response.  I suspect his symptoms are due to to vasovagal syncope that is likely poorly tolerated because of his resting low heart rate.  This may be worsened by an element of dehydration and orthostasis.  I am concerned about his symptoms given his personal injury during a fall on the stairs during a syncopal episode.  I have advised him to avoid driving or operating heavy machinery."  Since I last saw him he has not had another episode of syncope or presyncope.  He feels tired all the time.      Past Medical History:  Diagnosis Date   Bradycardia     Past Surgical History:  Procedure Laterality Date   CERVICAL SPINE SURGERY      Current Medications: No outpatient medications have been marked as taking for the 01/18/22 encounter (Office Visit) with Vickie Epley, MD.     Allergies:   Patient has no known allergies.   Social History   Socioeconomic History   Marital status: Married    Spouse name: Not on file   Number of children: Not on file   Years of education: Not on file   Highest education level: Not on file  Occupational History   Not on file  Tobacco Use   Smoking status: Never   Smokeless tobacco: Current    Types: Chew  Vaping Use   Vaping Use: Never used  Substance and Sexual Activity   Alcohol use: Yes    Comment: 6-8  beers on the weekends   Drug use: No   Sexual activity: Yes  Other Topics Concern   Not on file  Social History Narrative   Not on file   Social Determinants of Health   Financial Resource Strain: Not on file  Food Insecurity: Not on file  Transportation Needs: Not on file  Physical Activity: Not on file  Stress: Not on file  Social Connections: Not on file     Family History: The patient's family history includes Alcohol abuse in his brother; Cancer in his father; Diabetes in his mother; Hyperlipidemia in his brother and mother; Hypertension in his brother, brother, brother, brother, mother, and sister.  ROS:   Please see the history of present illness.    All other systems reviewed and are negative.  EKGs/Labs/Other Studies Reviewed:    The following studies were reviewed today:     Recent Labs: 04/20/2021: ALT 18; Hemoglobin 16.1; Platelets 190; TSH 1.29 01/11/2022: BUN 14; Creatinine, Ser 0.96; Potassium 4.5; Sodium 140  Recent Lipid Panel    Component Value Date/Time   CHOL 134 04/20/2021 1014   TRIG 77 04/20/2021 1014   HDL 46 04/20/2021 1014   CHOLHDL 2.9 04/20/2021 1014   LDLCALC 72 04/20/2021 1014    Physical Exam:    VS:  BP 112/78  Pulse (!) 52   Ht 5\' 10"  (1.778 m)   Wt 153 lb (69.4 kg)   BMI 21.95 kg/m     Wt Readings from Last 3 Encounters:  01/18/22 153 lb (69.4 kg)  01/11/22 156 lb (70.8 kg)  12/30/21 151 lb (68.5 kg)     GEN:  Well nourished, well developed in no acute distress HEENT: Normal NECK: No JVD; No carotid bruits LYMPHATICS: No lymphadenopathy CARDIAC: RRR, no murmurs, rubs, gallops RESPIRATORY:  Clear to auscultation without rales, wheezing or rhonchi  ABDOMEN: Soft, non-tender, non-distended MUSCULOSKELETAL:  No edema; No deformity  SKIN: Warm and dry NEUROLOGIC:  Alert and oriented x 3 PSYCHIATRIC:  Normal affect        ASSESSMENT:    1. Syncope and collapse    PLAN:    In order of problems listed  above:  #Syncope Plan for ILR given concerning nature of his syncopal episodes. I have discussed the procedure in detail including the risks and monthly monitoring costs and he wishes to proceed.    Medication Adjustments/Labs and Tests Ordered: Current medicines are reviewed at length with the patient today.  Concerns regarding medicines are outlined above.  No orders of the defined types were placed in this encounter.  No orders of the defined types were placed in this encounter.    Signed, 03/01/22, MD, [facility], [facility] 01/18/2022 1:24 PM    Electrophysiology [facility]  -------------------------------  SURGEON:  01/20/2022, MD     PREPROCEDURE DIAGNOSIS:  Syncope    POSTPROCEDURE DIAGNOSIS: Syncope     PROCEDURES:   1. Implantable loop recorder implantation    INTRODUCTION:  Marc Dominguez presents with a history of syncope The costs of loop recorder monitoring have been discussed with the patient.    DESCRIPTION OF PROCEDURE:  Informed written consent was obtained.  The patient required no sedation for the procedure today.  Mapping over the patient's chest was performed to identify the area where electrograms were most prominent for ILR recording.  This area was found to be the left parasternal region over the 4th intercostal space. The patients left chest was therefore prepped and draped in the usual sterile fashion. The skin overlying the left parasternal region was infiltrated with lidocaine for local analgesia.  A 0.5-cm incision was made over the left parasternal region over the 3rd intercostal space.  A subcutaneous ILR pocket was fashioned using a combination of sharp and blunt dissection.  A Medtronic Reveal LINQ [id number]) implantable loop recorder was then placed into the pocket  R waves were very prominent and measured >0.32mV.  Steri- Strips and a sterile dressing were then applied.  There were no early apparent complications.      CONCLUSIONS:   1. Successful implantation of a implantable loop recorder for Syncope  2. No early apparent complications.   3m T. Sheria Lang, MD, [facility], [facility] Cardiac Electrophysiology

## 2022-01-18 ENCOUNTER — Encounter: Payer: Self-pay | Admitting: Cardiology

## 2022-01-18 ENCOUNTER — Ambulatory Visit: Payer: BC Managed Care – PPO | Attending: Cardiology | Admitting: Cardiology

## 2022-01-18 VITALS — BP 112/78 | HR 52 | Ht 70.0 in | Wt 153.0 lb

## 2022-01-18 DIAGNOSIS — R55 Syncope and collapse: Secondary | ICD-10-CM

## 2022-01-18 NOTE — Patient Instructions (Signed)
Medication Instructions:  Your physician recommends that you continue on your current medications as directed. Please refer to the Current Medication list given to you today.  Labwork: None ordered.  Testing/Procedures: None ordered.  Follow-Up:  Your physician wants you to follow-up in: 6 months with APP.     Implantable Loop Recorder Placement, Care After This sheet gives you information about how to care for yourself after your procedure. Your health care provider may also give you more specific instructions. If you have problems or questions, contact your health care provider. What can I expect after the procedure? After the procedure, it is common to have: Soreness or discomfort near the incision. Some swelling or bruising near the incision.  Follow these instructions at home: Incision care  Monitor your cardiac device site for redness, swelling, and drainage. Call the device clinic at [phone] if you experience these symptoms or fever/chills.  Keep the large square bandage on your site for 24 hours and then you may remove it yourself. Keep the steri-strips underneath in place.   You may shower after 72 hours / 3 days from your procedure with the steri-strips in place. They will usually fall off on their own, or may be removed after 10 days. Pat dry.   Avoid lotions, ointments, or perfumes over your incision until it is well-healed.  Please do not submerge in water until your site is completely healed.   Your device is MRI compatible.   Remote monitoring is used to monitor your cardiac device from home. This monitoring is scheduled every month by our office. It allows [place] to keep an eye on the function of your device to ensure it is working properly.  If your wound site starts to bleed apply pressure.    For help with the monitor please call Medtronic Monitor Support Specialist directly at [phone].    If you have any questions/concerns please call the device  clinic at [phone].  Activity  Return to your normal activities.  General instructions Follow instructions from your health care provider about how to manage your implantable loop recorder and transmit the information. Learn how to activate a recording if this is necessary for your type of device. You may go through a metal detection gate, and you may let someone hold a metal detector over your chest. Show your ID card if needed. Do not have an MRI unless you check with your health care provider first. Take over-the-counter and prescription medicines only as told by your health care provider. Keep all follow-up visits as told by your health care provider. This is important. Contact a health care provider if: You have redness, swelling, or pain around your incision. You have a fever. You have pain that is not relieved by your pain medicine. You have triggered your device because of fainting (syncope) or because of a heartbeat that feels like it is racing, slow, fluttering, or skipping (palpitations). Get help right away if you have: Chest pain. Difficulty breathing. Summary After the procedure, it is common to have soreness or discomfort near the incision. Change your dressing as told by your health care provider. Follow instructions from your health care provider about how to manage your implantable loop recorder and transmit the information. Keep all follow-up visits as told by your health care provider. This is important. This information is not intended to replace advice given to you by your health care provider. Make sure you discuss any questions you have with your health care provider. Document Released:  03/29/2015 Document Revised: 06/02/2017 Document Reviewed: 06/02/2017 Elsevier Patient Education  [place].

## 2022-02-20 ENCOUNTER — Ambulatory Visit (INDEPENDENT_AMBULATORY_CARE_PROVIDER_SITE_OTHER): Payer: BC Managed Care – PPO

## 2022-02-20 DIAGNOSIS — R55 Syncope and collapse: Secondary | ICD-10-CM | POA: Diagnosis not present

## 2022-02-21 ENCOUNTER — Encounter: Payer: Self-pay | Admitting: Cardiology

## 2022-02-21 ENCOUNTER — Ambulatory Visit: Payer: BC Managed Care – PPO | Attending: Cardiology | Admitting: Cardiology

## 2022-02-21 VITALS — BP 110/64 | HR 44 | Ht 70.0 in | Wt 160.5 lb

## 2022-02-21 DIAGNOSIS — R9430 Abnormal result of cardiovascular function study, unspecified: Secondary | ICD-10-CM

## 2022-02-21 DIAGNOSIS — R001 Bradycardia, unspecified: Secondary | ICD-10-CM | POA: Diagnosis not present

## 2022-02-21 LAB — CUP PACEART REMOTE DEVICE CHECK
Date Time Interrogation Session: 20231023211302
Implantable Pulse Generator Implant Date: 20230920

## 2022-02-21 NOTE — Patient Instructions (Signed)
Medication Instructions:   Your physician recommends that you continue on your current medications as directed. Please refer to the Current Medication list given to you today.   *If you need a refill on your cardiac medications before your next appointment, please call your pharmacy*    Follow-Up: At [facility], you and your health needs are our priority.  As part of our continuing mission to provide you with exceptional heart care, we have created designated Provider Care Teams.  These Care Teams include your primary Cardiologist (physician) and Advanced Practice Providers (APPs -  Physician Assistants and Nurse Practitioners) who all work together to provide you with the care you need, when you need it.  We recommend signing up for the patient portal called "MyChart".  Sign up information is provided on this After Visit Summary.  MyChart is used to connect with patients for Virtual Visits (Telemedicine).  Patients are able to view lab/test results, encounter notes, upcoming appointments, etc.  Non-urgent messages can be sent to your provider as well.   To learn more about what you can do with MyChart, go to [email or web address].    Your next appointment:   6 month(s)  The format for your next appointment:   In Person  Provider:   Brian Agbor-Etang, MD    Other Instructions    Important Information About Sugar       

## 2022-02-21 NOTE — Progress Notes (Signed)
Cardiology Office Note:    Date:  02/21/2022   ID:  Marc Dominguez, DOB November 13, 1975, MRN [id number]  PCP:  Teodora Medici, [place] Providers Cardiologist:  None Electrophysiologist:  Vickie Epley, MD     Referring MD: Teodora Medici, DO   Chief Complaint  Patient presents with   Follow-up    6-8 week f/u, sob, weakness     History of Present Illness:    Marc Dominguez is a 46 y.o. male with a hx of sinus bradycardia who presents for follow-up.  Previously seen due to bradycardia and syncope.  Cardiac monitor showed nonsustained VT, evaluated by EP, ILR placed.  Has not had any further symptoms since ILR placement.  Coronary CTA showed no evidence of CAD.  Prior notes Cardiac monitor 11/2021 occasional NSVT, minimum heart rate 30 during sleep.  Good chronotropic response Echo 11/2021 EF 45% Coronary CT 12/2021 no CAD, calcium score 0   Past Medical History:  Diagnosis Date   Bradycardia     Past Surgical History:  Procedure Laterality Date   CERVICAL SPINE SURGERY      Current Medications: No outpatient medications have been marked as taking for the 02/21/22 encounter (Office Visit) with Kate Sable, MD.     Allergies:   Patient has no known allergies.   Social History   Socioeconomic History   Marital status: Married    Spouse name: Not on file   Number of children: Not on file   Years of education: Not on file   Highest education level: Not on file  Occupational History   Not on file  Tobacco Use   Smoking status: Never   Smokeless tobacco: Current    Types: Chew  Vaping Use   Vaping Use: Never used  Substance and Sexual Activity   Alcohol use: Yes    Comment: 6-8 beers on the weekends   Drug use: No   Sexual activity: Yes  Other Topics Concern   Not on file  Social History Narrative   Not on file   Social Determinants of Health   Financial Resource Strain: Not on file  Food Insecurity: Not on file   Transportation Needs: Not on file  Physical Activity: Not on file  Stress: Not on file  Social Connections: Not on file     Family History: The patient's family history includes Alcohol abuse in his brother; Cancer in his father; Diabetes in his mother; Hyperlipidemia in his brother and mother; Hypertension in his brother, brother, brother, brother, mother, and sister.  ROS:   Please see the history of present illness.     All other systems reviewed and are negative.  EKGs/Labs/Other Studies Reviewed:    The following studies were reviewed today:   EKG:  EKG is  ordered today.  The ekg ordered today demonstrates sinus bradycardia, heart rate 44  Recent Labs: 04/20/2021: ALT 18; Hemoglobin 16.1; Platelets 190; TSH 1.29 01/11/2022: BUN 14; Creatinine, Ser 0.96; Potassium 4.5; Sodium 140  Recent Lipid Panel    Component Value Date/Time   CHOL 134 04/20/2021 1014   TRIG 77 04/20/2021 1014   HDL 46 04/20/2021 1014   CHOLHDL 2.9 04/20/2021 1014   LDLCALC 72 04/20/2021 1014     Risk Assessment/Calculations:         Physical Exam:    VS:  BP 110/64 (BP Location: Left Arm, Patient Position: Sitting, Cuff Size: Normal)   Pulse (!) 44   Ht 5\' 10"  (  1.778 m)   Wt 160 lb 8 oz (72.8 kg)   SpO2 98%   BMI 23.03 kg/m     Wt Readings from Last 3 Encounters:  02/21/22 160 lb 8 oz (72.8 kg)  01/18/22 153 lb (69.4 kg)  01/11/22 156 lb (70.8 kg)     GEN:  Well nourished, well developed in no acute distress HEENT: Normal NECK: No JVD; No carotid bruits CARDIAC: Bradycardic, regular, no murmurs RESPIRATORY:  Clear to auscultation without rales, wheezing or rhonchi  ABDOMEN: Soft, non-tender, non-distended MUSCULOSKELETAL:  No edema; No deformity  SKIN: Warm and dry NEUROLOGIC:  Alert and oriented x 3 PSYCHIATRIC:  Normal affect   ASSESSMENT:    1. Symptomatic bradycardia   2. Ejection fraction < 50%    PLAN:    In order of problems listed above:  Bradycardia, appears  symptomatic, cardiac monitor with occasional NSVT.  Symptoms overall appear vasovagal with prodromal symptoms.  No symptoms over the past several weeks.  Has a loop recorder in place.  Appreciate input from EP.  Further recs,?  Need for pacemaker as per EP. LVEF 45%.  Coronary CT with no CAD, calcium score 0.  Bradycardia, low blood pressures with dizziness preventing use of GDMT.  Patient appears euvolemic.  Continue to monitor.     Follow-up in 6 months.  Medication Adjustments/Labs and Tests Ordered: Current medicines are reviewed at length with the patient today.  Concerns regarding medicines are outlined above.  Orders Placed This Encounter  Procedures   EKG 12-Lead   No orders of the defined types were placed in this encounter.   Patient Instructions  Medication Instructions:   Your physician recommends that you continue on your current medications as directed. Please refer to the Current Medication list given to you today.  *If you need a refill on your cardiac medications before your next appointment, please call your pharmacy*    Follow-Up: At [facility], you and your health needs are our priority.  As part of our continuing mission to provide you with exceptional heart care, we have created designated Provider Care Teams.  These Care Teams include your primary Cardiologist (physician) and Advanced Practice Providers (APPs -  Physician Assistants and Nurse Practitioners) who all work together to provide you with the care you need, when you need it.  We recommend signing up for the patient portal called "MyChart".  Sign up information is provided on this After Visit Summary.  MyChart is used to connect with patients for Virtual Visits (Telemedicine).  Patients are able to view lab/test results, encounter notes, upcoming appointments, etc.  Non-urgent messages can be sent to your provider as well.   To learn more about what you can do with MyChart, go to  [email or web address].    Your next appointment:   6 month(s)  The format for your next appointment:   In Person  Provider:   Debbe Odea, MD    Other Instructions   Important Information About Sugar         Signed, Debbe Odea, MD  02/21/2022 9:53 AM    [facility]

## 2022-03-20 NOTE — Progress Notes (Signed)
Carelink Summary Report / Loop Recorder 

## 2022-03-22 ENCOUNTER — Ambulatory Visit (INDEPENDENT_AMBULATORY_CARE_PROVIDER_SITE_OTHER): Payer: BC Managed Care – PPO

## 2022-03-22 DIAGNOSIS — I429 Cardiomyopathy, unspecified: Secondary | ICD-10-CM | POA: Diagnosis not present

## 2022-03-24 LAB — CUP PACEART REMOTE DEVICE CHECK
Date Time Interrogation Session: 20231123201508
Implantable Pulse Generator Implant Date: 20230920

## 2022-04-13 NOTE — Progress Notes (Signed)
Carelink Summary Report / Loop Recorder 

## 2022-04-25 ENCOUNTER — Ambulatory Visit (INDEPENDENT_AMBULATORY_CARE_PROVIDER_SITE_OTHER): Payer: BC Managed Care – PPO

## 2022-04-25 DIAGNOSIS — I429 Cardiomyopathy, unspecified: Secondary | ICD-10-CM | POA: Diagnosis not present

## 2022-04-25 LAB — CUP PACEART REMOTE DEVICE CHECK
Date Time Interrogation Session: 20231224201654
Implantable Pulse Generator Implant Date: 20230920

## 2022-05-22 NOTE — Progress Notes (Signed)
Carelink Summary Report / Loop Recorder

## 2022-05-29 ENCOUNTER — Ambulatory Visit: Payer: BC Managed Care – PPO

## 2022-05-29 DIAGNOSIS — R55 Syncope and collapse: Secondary | ICD-10-CM | POA: Diagnosis not present

## 2022-05-30 LAB — CUP PACEART REMOTE DEVICE CHECK
Date Time Interrogation Session: 20240128232331
Implantable Pulse Generator Implant Date: 20230920

## 2022-06-29 LAB — CUP PACEART REMOTE DEVICE CHECK
Date Time Interrogation Session: 20240228231919
Implantable Pulse Generator Implant Date: 20230920

## 2022-06-30 ENCOUNTER — Telehealth: Payer: Self-pay

## 2022-06-30 NOTE — Telephone Encounter (Addendum)
Alert received from [employer] regarding a bradycardia event at 5:58 am.  Please call Pt and confirm he was asleep at this time.

## 2022-06-30 NOTE — Telephone Encounter (Signed)
Outreach made to Pt.  He states he was just getting up when bradycardia was noted.  He states he stopped chewing tobacco a few days ago and has only been sleeping a few hours at night.  He states he was asymptomatic with this episode.  Direct number to device clinic given if he ever feels dizzy or presyncopal.  He also has a symptom activator.  Advised to call.  Pt indicates understanding.

## 2022-06-30 NOTE — Telephone Encounter (Signed)
I spoke with the patient and he states he was up sitting on the couch. He states he was feeling normal. He didn't feel like he was having an episode. I told him the nurse will give him a call back.

## 2022-07-03 ENCOUNTER — Ambulatory Visit (INDEPENDENT_AMBULATORY_CARE_PROVIDER_SITE_OTHER): Payer: BC Managed Care – PPO

## 2022-07-03 DIAGNOSIS — R55 Syncope and collapse: Secondary | ICD-10-CM

## 2022-07-13 NOTE — Progress Notes (Signed)
Carelink Summary Report / Loop Recorder 

## 2022-08-05 ENCOUNTER — Encounter: Payer: Self-pay | Admitting: Cardiology

## 2022-08-05 DIAGNOSIS — R55 Syncope and collapse: Secondary | ICD-10-CM | POA: Insufficient documentation

## 2022-08-05 NOTE — Progress Notes (Deleted)
Cardiology Office Note Date:  08/05/2022  Patient ID:  Marc Dominguez, DOB 18-Jul-1975, MRN [id number] PCP:  Margarita Mail, DO  Cardiologist:  Debbe Odea, MD Electrophysiologist: Lanier Prude, MD  ***refresh   Chief Complaint: 6 month follow-up  History of Present Illness: Marc Dominguez is a 47 y.o. male with PMH notable for syncope, bradycardia, HFmrEF; seen today for Lanier Prude, MD for routine electrophysiology followup.  He last saw Dr. Lalla Brothers 12/2021, ILR placed for syncopal events. Suspected syncope related to vasovagal syncope in combination with his baseline bradycardia, perhaps exacerbated by dehydration and orthostasis.   *** syncope episodes *** fluid status *** diet/hydration/support socks *** PCP?  Since last being seen in our clinic the patient reports doing ***.  he denies chest pain, palpitations, dyspnea, PND, orthopnea, nausea, vomiting, dizziness, syncope, edema, weight gain, or early satiety.     Device Information: MDT ILR, imp 12/2021; dx syncope  Past Medical History:  Diagnosis Date   Bradycardia    Heart failure with mildly reduced ejection fraction (HFmrEF)    Syncope    Syncope and collapse     Past Surgical History:  Procedure Laterality Date   CERVICAL SPINE SURGERY      No current outpatient medications    Social History:  The patient  reports that he has never smoked. His smokeless tobacco use includes chew. He reports current alcohol use. He reports that he does not use drugs.   Family History:  *** include only if pertinent The patient's family history includes Alcohol abuse in his brother; Cancer in his father; Diabetes in his mother; Hyperlipidemia in his brother and mother; Hypertension in his brother, brother, brother, brother, mother, and sister.***  ROS:  Please see the history of present illness. All other systems are reviewed and otherwise negative.   PHYSICAL EXAM: *** VS:  There were no vitals taken for  this visit. BMI: There is no height or weight on file to calculate BMI.  GEN- The patient is well appearing, alert and oriented x 3 today.   HEENT: normocephalic, atraumatic; sclera clear, conjunctiva pink; hearing intact; oropharynx clear; neck supple, no JVP Lungs- Clear to ausculation bilaterally, normal work of breathing.  No wheezes, rales, rhonchi Heart- {Blank single:19197::"Regular","Irregularly irregular"} rate and rhythm, no murmurs, rubs or gallops, PMI not laterally displaced GI- soft, non-tender, non-distended, bowel sounds present, no hepatosplenomegaly Extremities- {EDEMA LEVEL:28147::"No"} peripheral edema. no clubbing or cyanosis; DP/PT/radial pulses 2+ bilaterally MS- no significant deformity or atrophy Skin- warm and dry, no rash or lesion, *** ILR insertion site well-healed Psych- euthymic mood, full affect Neuro- strength and sensation are intact   Device interrogation done today and reviewed by myself:  Battery *** Lead thresholds, impedence, sensing stable *** *** episodes *** changes made today  EKG is ordered. Personal review of EKG from today shows:  ***  Recent Labs: 01/11/2022: BUN 14; Creatinine, Ser 0.96; Potassium 4.5; Sodium 140  No results found for requested labs within last 365 days.   CrCl cannot be calculated (Patient's most recent lab result is older than the maximum 21 days allowed.).   Wt Readings from Last 3 Encounters:  02/21/22 160 lb 8 oz (72.8 kg)  01/18/22 153 lb (69.4 kg)  01/11/22 156 lb (70.8 kg)     Additional studies reviewed include: Previous EP, cardiology notes.   CT Coronary, 01/12/2022 1. Normal coronary calcium score of 0. Patient is low risk for coronary events. 2. Normal coronary origin with right dominance.  3. No evidence of CAD. 4. CAD-RADS 0. Consider non-ischemic causes for cardiomyopathy.  TTE, 12/01/2021  1. Left ventricular ejection fraction, by estimation, is 40 to 45%. The left ventricle has mildly decreased  function. The left ventricle  demonstrates global hypokinesis. Left ventricular diastolic parameters are consistent with Grade II diastolic dysfunction (pseudonormalization). The average left ventricular global longitudinal strain is -16.3 %.   2. Right ventricular systolic function is normal. The right ventricular  size is normal. There is normal pulmonary artery systolic pressure. The estimated right ventricular systolic pressure is 17.7 mmHg.   3. Left atrial size was moderately dilated.   4. The mitral valve is moderately thickened. Mild mitral valve  regurgitation. No evidence of mitral stenosis. There is mild late systolic prolapse of both leaflets of the mitral valve.   5. Tricuspid valve regurgitation is mild to moderate.   6. The aortic valve has an indeterminant number of cusps. Aortic valve regurgitation is trivial. No aortic stenosis is present.   7. The inferior vena cava is normal in size with greater than 50%  respiratory variability, suggesting right atrial pressure of 3 mmHg.   8. Marked bradycardia noted, rate 42 bpm   Long Term Monitor, 11/08/2021 Occasional nonsustained VT, frequent PVCs associated with patient triggered events noted. Minimum heart rate of 30 likely occured when patient was asleep. No findings of high degree AV block noted.    ASSESSMENT AND PLAN:  #) Syncope  #) bradycardia   #) ***    Current medicines are reviewed at length with the patient today.   The patient {ACTIONS; HAS/DOES NOT HAVE:19233} concerns regarding his medicines.  The following changes were made today:  {NONE DEFAULTED:18576}  Labs/ tests ordered today include: *** No orders of the defined types were placed in this encounter.    Disposition: Follow up with {EPMDS:28135} in {EPFOLLOW UP:28173}   Signed, Sherie Don, NP  08/05/22  6:28 PM  Electrophysiology [facility]

## 2022-08-07 ENCOUNTER — Ambulatory Visit (INDEPENDENT_AMBULATORY_CARE_PROVIDER_SITE_OTHER): Payer: BC Managed Care – PPO

## 2022-08-07 DIAGNOSIS — R55 Syncope and collapse: Secondary | ICD-10-CM | POA: Diagnosis not present

## 2022-08-07 LAB — CUP PACEART REMOTE DEVICE CHECK
Date Time Interrogation Session: 20240407231446
Implantable Pulse Generator Implant Date: 20230920

## 2022-08-08 ENCOUNTER — Ambulatory Visit: Payer: BC Managed Care – PPO | Attending: Cardiology | Admitting: Cardiology

## 2022-08-08 DIAGNOSIS — R001 Bradycardia, unspecified: Secondary | ICD-10-CM

## 2022-08-08 DIAGNOSIS — R55 Syncope and collapse: Secondary | ICD-10-CM

## 2022-08-11 NOTE — Progress Notes (Signed)
Carelink Summary Report / Loop Recorder 

## 2022-08-29 ENCOUNTER — Ambulatory Visit: Payer: BC Managed Care – PPO | Admitting: Cardiology

## 2022-09-05 NOTE — Progress Notes (Signed)
   Acute Office Visit  Subjective:     Patient ID: Marc Dominguez, male    DOB: 03-27-1976, 47 y.o.   MRN: [id number]  Chief Complaint  Patient presents with   Leg Pain    Right    HPI Patient is in today for right posterior leg pain. Has been progressively worsening over 1 month. Pain is primarily located behind the right knee where he does have swelling.  There is no overlying skin change.  He does have chronic bilateral knee pain.  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  He does not wear any protective pads, states the strap irritate the swelling in the back of his knee.  He did take some Advil which did not help his pain.  He tried heat which actively worsened the pain and made it radiate down his calf.  Review of Systems  Constitutional:  Negative for chills and fever.  Musculoskeletal:  Positive for joint pain.  Skin: Negative.         Objective:    BP 132/80   Pulse 65   Resp 16   Ht 5\' 10"  (1.778 m)   Wt 163 lb (73.9 kg)   SpO2 98%   BMI 23.39 kg/m  BP Readings from Last 3 Encounters:  09/07/22 132/80  02/21/22 110/64  01/18/22 112/78   Wt Readings from Last 3 Encounters:  09/07/22 163 lb (73.9 kg)  02/21/22 160 lb 8 oz (72.8 kg)  01/18/22 153 lb (69.4 kg)      Physical Exam Constitutional:      Appearance: Normal appearance.  HENT:     Head: Normocephalic and atraumatic.  Eyes:     Conjunctiva/sclera: Conjunctivae normal.  Cardiovascular:     Rate and Rhythm: Normal rate and regular rhythm.  Pulmonary:     Effort: Pulmonary effort is normal.     Breath sounds: Normal breath sounds.  Musculoskeletal:        General: Swelling and tenderness present.     Right lower leg: No edema.     Left lower leg: No edema.     Comments: Posterior knee swelling with tenderness, decreased extension and flexion of knee  Skin:    General: Skin is warm and dry.     Comments: Varicose veins on posterior aspect of knees  Neurological:      General: No focal deficit present.     Mental Status: He is alert. Mental status is at baseline.  Psychiatric:        Mood and Affect: Mood normal.        Thought Content: Thought content normal.     No results found for any visits on 09/07/22.      Assessment & Plan:   1. Bursitis of other bursa of right knee: Swelling of posterior aspect of right knee, will obtain an x-ray first to visualize joint space and possible bursitis.  Will treat with naproxen 500 mg twice daily for 10 days.  May require an ultrasound of the joint as well.  Superficial varicose veins present but not inflamed, no other lower extremity swelling present.  - DG Knee Complete 4 Views Right; Future - naproxen (NAPROSYN) 500 MG tablet; Take 1 tablet (500 mg total) by mouth 2 (two) times daily with a meal for 10 days.  Dispense: 20 tablet; Refill: 0   Return if symptoms worsen or fail to improve.  Margarita Mail, DO

## 2022-09-07 ENCOUNTER — Ambulatory Visit
Admission: RE | Admit: 2022-09-07 | Discharge: 2022-09-07 | Disposition: A | Discharge status: Home / Self Care | Payer: BC Managed Care – PPO | Source: Ambulatory Visit | Attending: Internal Medicine | Admitting: Internal Medicine

## 2022-09-07 ENCOUNTER — Ambulatory Visit
Admission: RE | Admit: 2022-09-07 | Discharge: 2022-09-07 | Disposition: A | Discharge status: Home / Self Care | Payer: BC Managed Care – PPO | Attending: Internal Medicine | Admitting: Internal Medicine

## 2022-09-07 ENCOUNTER — Ambulatory Visit: Payer: BC Managed Care – PPO | Admitting: Internal Medicine

## 2022-09-07 VITALS — BP 132/80 | HR 65 | Resp 16 | Ht 70.0 in | Wt 163.0 lb

## 2022-09-07 DIAGNOSIS — M25561 Pain in right knee: Secondary | ICD-10-CM | POA: Diagnosis not present

## 2022-09-07 DIAGNOSIS — M7051 Other bursitis of knee, right knee: Secondary | ICD-10-CM

## 2022-09-07 LAB — CUP PACEART REMOTE DEVICE CHECK
Date Time Interrogation Session: 20240508231308
Implantable Pulse Generator Implant Date: 20230920

## 2022-09-07 MED ORDER — NAPROXEN 500 MG PO TABS: 500.0000 mg | ORAL_TABLET | Freq: Two times a day (BID) | ORAL | 0 refills | Status: AC

## 2022-09-07 NOTE — Patient Instructions (Addendum)
It was great seeing you today!  Plan discussed at today's visit: -Start with knee x-ray, based on this I will most likely ordered an ultrasound as well -Take Naproxen 500 mg twice daily for 10 days with food and avoid other anti-inflammatories   Follow up in: as needed  Take care and let us know if you have any questions or concerns prior to your next visit.  Dr. Caralee Ates  Bursitis  Bursitis is inflammation and irritation of a bursa, which is a small fluid-filled sac that cushions and protects the joints. These sacs are located between bones and muscles, bones and muscle attachments, or bones and skin areas that are next to bones. A bursa protects those structures from the wear and tear that results from frequent movement. If the bursa becomes irritated, it can fill with extra fluid. Bursitis is most common near joints, especially the knees, elbows, hips, and shoulders. What are the causes? This condition may be caused by: An injury like a direct hit to a joint area, such as falling on your knee or elbow. Overuse of a joint (repetitive stress). Infection. This can happen if bacteria get into a bursa through a cut or scrape near a joint. Diseases that cause joint inflammation, such as gout and rheumatoid arthritis. What increases the risk? You are more likely to develop this condition if: You have a job or hobby that involves a lot of repetitive stress on your joints. You have a condition that weakens your body's defense system (immune system), such as diabetes, cancer, or HIV. You do any of these often: Lift and reach overhead. Kneel or lean on hard surfaces. Participate in physical activities that include repetitive motion, like running or walking. What are the signs or symptoms? Common symptoms of this condition include: Pain that gets worse when you move the affected body part or use it to support your body weight. Inflammation. Stiffness. Other symptoms  include: Redness. Swelling. Tenderness. Warmth. Pain that continues after rest. Fever or chills. These may occur in bursitis that is caused by infection. How is this diagnosed? This condition may be diagnosed based on: Your medical history and a physical exam. Imaging tests, such as an MRI or X-ray. Draining fluid from the bursa to test it for infection or gout. Blood tests to rule out other causes of inflammation. How is this treated? This condition can usually be treated at home with rest, ice, applying pressure (compression), and raising the body part that is affected (elevation). This is called RICE therapy. For mild bursitis, RICE therapy may be all you need. Other treatments may include: Over-the-counter medicines to relieve pain and inflammation. Injections of anti-inflammatory medicines. These medicines may be injected into and around the area of bursitis. Draining of fluid from the bursa to relieve pain and improve movement. Antibiotic medicine if there is an infection. Using a splint, brace, elastic wrap, pads, or walking aid, such as a cane. Physical or occupational therapy if you continue to have pain or limited movement. Your physical or occupational therapist can help determine what may have caused or contributed to the bursitis. This will help avoid further episodes. Surgery to remove a damaged or infected bursa. This may be needed if other treatments have not worked. Follow these instructions at home: Medicines Take over-the-counter and prescription medicines only as told by your health care provider. If you were prescribed an antibiotic medicine, take it as told by your health care provider. Do not stop using the antibiotic even if you  start to feel better. Managing pain, stiffness, and swelling     Raise (elevate) the injured area above the level of your heart while you are sitting or lying down. If directed, put ice on the affected area. To do this: Put ice in a  plastic bag. Place a towel between your skin and the bag, or between your splint or brace and the bag. Leave the ice on for 20 minutes, 2-3 times a day. Remove the ice if your skin turns bright red. This is very important. If you cannot feel pain, heat, or cold, you have a greater risk of damage to the area. If directed, apply heat to the affected area as often as told by your health care provider. Use the heat source that your health care provider recommends, such as a moist heat pack or a heating pad. Place a towel between your skin and the heat source. Leave the heat on for 20-30 minutes. Remove the heat if your skin turns bright red. This is especially important if you are unable to feel pain, heat, or cold. You may have a greater risk of getting burned. General instructions Rest the affected area as told by your health care provider. Avoid activities that make pain worse. Use splints, braces, pads, elastic wrap, or walking aids as told by your health care provider. Keep all follow-up visits. This is important. Preventing future episodes Wear knee pads if you kneel often. Wear sturdy running or walking shoes that fit well. Take breaks regularly from repetitive activity. Warm up by stretching before doing any activity that takes a lot of effort. Maintain a healthy weight or lose weight as recommended by your health care provider. If you need help doing this, ask your health care provider. Exercise regularly. Start any new physical activity gradually. Work with your physical or occupational therapist and your health care provider to help determine what caused the bursitis. Contact a health care provider if: You have a fever or chills. Your symptoms do not get better with treatment. You have pain or swelling that gets worse, or it goes away and then comes back. You have pus draining from the affected area. You have redness around the affected area. The affected area is warm to the  touch. Summary Bursitis is inflammation and irritation of a bursa, which is a small fluid-filled sac that cushions and protects the joints. Rest the affected area as told by your health care provider. Avoid activities that make pain worse. This condition can usually be treated at home with rest, ice, applying pressure (compression), and raising the body part that is affected (elevation). This is called RICE therapy. This information is not intended to replace advice given to you by your health care provider. Make sure you discuss any questions you have with your health care provider. Document Revised: 04/12/2021 Document Reviewed: 04/12/2021 Elsevier Patient Education  2023 [employer].

## 2022-09-11 ENCOUNTER — Ambulatory Visit (INDEPENDENT_AMBULATORY_CARE_PROVIDER_SITE_OTHER): Payer: BC Managed Care – PPO

## 2022-09-11 DIAGNOSIS — R55 Syncope and collapse: Secondary | ICD-10-CM

## 2022-09-11 NOTE — Addendum Note (Signed)
Addended by: Margarita Mail on: 09/11/2022 12:48 PM   Modules accepted: Orders

## 2022-09-13 NOTE — Progress Notes (Signed)
Carelink Summary Report / Loop Recorder 

## 2022-09-14 ENCOUNTER — Other Ambulatory Visit: Payer: Self-pay | Admitting: Internal Medicine

## 2022-09-14 DIAGNOSIS — M7051 Other bursitis of knee, right knee: Secondary | ICD-10-CM

## 2022-09-14 NOTE — Telephone Encounter (Signed)
Requested Prescriptions  Pending Prescriptions Disp Refills   naproxen (NAPROSYN) 500 MG tablet [Pharmacy Med Name: NAPROXEN 500MG  TABLETS] 20 tablet 0    Sig: TAKE 1 TABLET(500 MG) BY MOUTH TWICE DAILY WITH A MEAL FOR 10 DAYS     Analgesics:  NSAIDS Failed - 09/14/2022  3:36 AM      Failed - Manual Review: Labs are only required if the patient has taken medication for more than 8 weeks.      Failed - HGB in normal range and within 360 days    Hemoglobin  Date Value Ref Range Status  04/20/2021 16.1 13.2 - 17.1 g/dL Final   HGB  Date Value Ref Range Status  08/18/2013 15.5 13.0 - 18.0 g/dL Final         Failed - PLT in normal range and within 360 days    Platelets  Date Value Ref Range Status  04/20/2021 190 140 - 400 Thousand/uL Final   Platelet  Date Value Ref Range Status  08/18/2013 225 150 - 440 x10 3/mm 3 Final         Failed - HCT in normal range and within 360 days    HCT  Date Value Ref Range Status  04/20/2021 46.9 38.5 - 50.0 % Final  08/18/2013 46.3 40.0 - 52.0 % Final         Passed - Cr in normal range and within 360 days    Creat  Date Value Ref Range Status  04/20/2021 0.87 0.60 - 1.29 mg/dL Final   Creatinine, Ser  Date Value Ref Range Status  01/11/2022 0.96 0.61 - 1.24 mg/dL Final         Passed - eGFR is 30 or above and within 360 days    EGFR (African American)  Date Value Ref Range Status  08/18/2013 >60  Final   GFR calc Af Amer  Date Value Ref Range Status  01/02/2018 >60 >60 mL/min Final    Comment:    (NOTE) The eGFR has been calculated using the CKD EPI equation. This calculation has not been validated in all clinical situations. eGFR's persistently <60 mL/min signify possible Chronic Kidney Disease.    EGFR (Non-African Amer.)  Date Value Ref Range Status  08/18/2013 >60  Final    Comment:    eGFR values <39mL/min/1.73 m2 may be an indication of chronic kidney disease (CKD). Calculated eGFR is useful in patients with  stable renal function. The eGFR calculation will not be reliable in acutely ill patients when serum creatinine is changing rapidly. It is not useful in  patients on dialysis. The eGFR calculation may not be applicable to patients at the low and high extremes of body sizes, pregnant women, and vegetarians.    GFR, Estimated  Date Value Ref Range Status  01/11/2022 >60 >60 mL/min Final    Comment:    (NOTE) Calculated using the CKD-EPI Creatinine Equation (2021)    eGFR  Date Value Ref Range Status  04/20/2021 107 > OR = 60 mL/min/1.16m2 Final    Comment:    The eGFR is based on the CKD-EPI 2021 equation. To calculate  the new eGFR from a previous Creatinine or Cystatin C result, go to https://www.kidney.org/professionals/ kdoqi/gfr%5Fcalculator          Passed - Patient is not pregnant      Passed - Valid encounter within last 12 months    Recent Outpatient Visits           1 week ago  Bursitis of other bursa of right knee   [facility] Margarita Mail, DO   1 year ago Chesty cough   [facility] Alba Cory, MD   1 year ago Bradycardia   [facility] Margarita Mail, [place]

## 2022-10-05 NOTE — Progress Notes (Signed)
Carelink Summary Report / Loop Recorder 

## 2022-10-09 ENCOUNTER — Ambulatory Visit (INDEPENDENT_AMBULATORY_CARE_PROVIDER_SITE_OTHER): Payer: BC Managed Care – PPO

## 2022-10-09 DIAGNOSIS — R55 Syncope and collapse: Secondary | ICD-10-CM

## 2022-10-09 LAB — CUP PACEART REMOTE DEVICE CHECK
Date Time Interrogation Session: 20240608230548
Implantable Pulse Generator Implant Date: 20230920

## 2022-10-13 DIAGNOSIS — R55 Syncope and collapse: Secondary | ICD-10-CM

## 2022-10-31 NOTE — Progress Notes (Signed)
Carelink Summary Report / Loop Recorder 

## 2022-11-07 ENCOUNTER — Ambulatory Visit (INDEPENDENT_AMBULATORY_CARE_PROVIDER_SITE_OTHER): Payer: BC Managed Care – PPO

## 2022-11-07 DIAGNOSIS — I429 Cardiomyopathy, unspecified: Secondary | ICD-10-CM

## 2022-11-08 LAB — CUP PACEART REMOTE DEVICE CHECK
Date Time Interrogation Session: 20240709231148
Implantable Pulse Generator Implant Date: 20230920

## 2022-11-14 DIAGNOSIS — I429 Cardiomyopathy, unspecified: Secondary | ICD-10-CM | POA: Diagnosis not present

## 2022-11-28 ENCOUNTER — Other Ambulatory Visit: Payer: Self-pay | Admitting: Orthopedic Surgery

## 2022-11-28 DIAGNOSIS — R600 Localized edema: Secondary | ICD-10-CM

## 2022-11-28 DIAGNOSIS — M25461 Effusion, right knee: Secondary | ICD-10-CM | POA: Diagnosis not present

## 2022-11-29 ENCOUNTER — Ambulatory Visit
Admission: RE | Admit: 2022-11-29 | Discharge: 2022-11-29 | Disposition: A | Discharge status: Home / Self Care | Payer: BC Managed Care – PPO | Source: Ambulatory Visit | Attending: Orthopedic Surgery | Admitting: Orthopedic Surgery

## 2022-11-29 DIAGNOSIS — R600 Localized edema: Secondary | ICD-10-CM | POA: Diagnosis not present

## 2022-11-30 NOTE — Progress Notes (Signed)
Carelink Summary Report / Loop Recorder 

## 2022-12-11 ENCOUNTER — Ambulatory Visit (INDEPENDENT_AMBULATORY_CARE_PROVIDER_SITE_OTHER): Payer: BC Managed Care – PPO

## 2022-12-11 DIAGNOSIS — R55 Syncope and collapse: Secondary | ICD-10-CM

## 2022-12-18 DIAGNOSIS — R55 Syncope and collapse: Secondary | ICD-10-CM | POA: Diagnosis not present

## 2022-12-26 NOTE — Progress Notes (Signed)
Carelink Summary Report / Loop Recorder 

## 2023-01-14 LAB — CUP PACEART REMOTE DEVICE CHECK
Date Time Interrogation Session: 20240911231528
Implantable Pulse Generator Implant Date: 20230920

## 2023-01-15 ENCOUNTER — Ambulatory Visit (INDEPENDENT_AMBULATORY_CARE_PROVIDER_SITE_OTHER): Payer: BC Managed Care – PPO

## 2023-01-15 DIAGNOSIS — R55 Syncope and collapse: Secondary | ICD-10-CM

## 2023-01-22 DIAGNOSIS — R55 Syncope and collapse: Secondary | ICD-10-CM | POA: Diagnosis not present

## 2023-01-31 NOTE — Progress Notes (Signed)
Carelink Summary Report / Loop Recorder 

## 2023-02-19 ENCOUNTER — Ambulatory Visit (INDEPENDENT_AMBULATORY_CARE_PROVIDER_SITE_OTHER): Payer: BC Managed Care – PPO

## 2023-02-19 DIAGNOSIS — R55 Syncope and collapse: Secondary | ICD-10-CM

## 2023-02-20 LAB — CUP PACEART REMOTE DEVICE CHECK
Date Time Interrogation Session: 20241020231429
Implantable Pulse Generator Implant Date: 20230920

## 2023-03-08 NOTE — Progress Notes (Signed)
Carelink Summary Report / Loop Recorder 

## 2023-03-26 ENCOUNTER — Ambulatory Visit (INDEPENDENT_AMBULATORY_CARE_PROVIDER_SITE_OTHER): Payer: BC Managed Care – PPO

## 2023-03-26 DIAGNOSIS — R55 Syncope and collapse: Secondary | ICD-10-CM

## 2023-03-26 LAB — CUP PACEART REMOTE DEVICE CHECK
Date Time Interrogation Session: 20241122230508
Implantable Pulse Generator Implant Date: 20230920

## 2023-04-23 NOTE — Progress Notes (Signed)
Carelink Summary Report / Loop Recorder 

## 2023-04-30 ENCOUNTER — Ambulatory Visit (INDEPENDENT_AMBULATORY_CARE_PROVIDER_SITE_OTHER): Payer: BC Managed Care – PPO

## 2023-04-30 DIAGNOSIS — R55 Syncope and collapse: Secondary | ICD-10-CM

## 2023-04-30 LAB — CUP PACEART REMOTE DEVICE CHECK
Date Time Interrogation Session: 20241229231413
Implantable Pulse Generator Implant Date: 20230920

## 2023-05-31 ENCOUNTER — Ambulatory Visit (INDEPENDENT_AMBULATORY_CARE_PROVIDER_SITE_OTHER): Payer: BC Managed Care – PPO

## 2023-05-31 DIAGNOSIS — R55 Syncope and collapse: Secondary | ICD-10-CM | POA: Diagnosis not present

## 2023-05-31 LAB — CUP PACEART REMOTE DEVICE CHECK
Date Time Interrogation Session: 20250129231521
Implantable Pulse Generator Implant Date: 20230920

## 2023-06-03 ENCOUNTER — Encounter: Payer: Self-pay | Admitting: Cardiology

## 2023-07-05 ENCOUNTER — Ambulatory Visit (INDEPENDENT_AMBULATORY_CARE_PROVIDER_SITE_OTHER): Payer: BC Managed Care – PPO

## 2023-07-05 DIAGNOSIS — R55 Syncope and collapse: Secondary | ICD-10-CM

## 2023-07-07 LAB — CUP PACEART REMOTE DEVICE CHECK
Date Time Interrogation Session: 20250305231159
Implantable Pulse Generator Implant Date: 20230920

## 2023-07-09 NOTE — Progress Notes (Signed)
 [facility] Summary Report / Loop Recorder

## 2023-07-12 ENCOUNTER — Encounter: Payer: Self-pay | Admitting: Cardiology

## 2023-07-31 ENCOUNTER — Ambulatory Visit: Admitting: Cardiology

## 2023-08-07 ENCOUNTER — Ambulatory Visit: Admitting: Cardiology

## 2023-08-09 ENCOUNTER — Ambulatory Visit (INDEPENDENT_AMBULATORY_CARE_PROVIDER_SITE_OTHER): Payer: BC Managed Care – PPO

## 2023-08-09 DIAGNOSIS — R55 Syncope and collapse: Secondary | ICD-10-CM | POA: Diagnosis not present

## 2023-08-09 LAB — CUP PACEART REMOTE DEVICE CHECK
Date Time Interrogation Session: 20250409231226
Implantable Pulse Generator Implant Date: 20230920

## 2023-08-10 NOTE — Addendum Note (Signed)
 Addended by: Elease Etienne A on: 08/10/2023 11:59 AM   Modules accepted: Orders

## 2023-08-10 NOTE — Progress Notes (Signed)
 [facility] Summary Report / Loop Recorder

## 2023-08-11 ENCOUNTER — Encounter: Payer: Self-pay | Admitting: Cardiology

## 2023-09-13 ENCOUNTER — Ambulatory Visit (INDEPENDENT_AMBULATORY_CARE_PROVIDER_SITE_OTHER): Payer: BC Managed Care – PPO

## 2023-09-13 DIAGNOSIS — R55 Syncope and collapse: Secondary | ICD-10-CM

## 2023-09-13 LAB — CUP PACEART REMOTE DEVICE CHECK
Date Time Interrogation Session: 20250515010218
Implantable Pulse Generator Implant Date: 20230920

## 2023-09-16 ENCOUNTER — Ambulatory Visit: Payer: Self-pay | Admitting: Cardiology

## 2023-09-21 NOTE — Progress Notes (Signed)
 [facility] Summary Report / Loop Recorder

## 2023-09-30 ENCOUNTER — Ambulatory Visit
Admission: EM | Admit: 2023-09-30 | Discharge: 2023-09-30 | Disposition: A | Discharge status: Home / Self Care | Attending: Emergency Medicine | Admitting: Emergency Medicine

## 2023-09-30 ENCOUNTER — Encounter: Payer: Self-pay | Admitting: Emergency Medicine

## 2023-09-30 DIAGNOSIS — T161XXA Foreign body in right ear, initial encounter: Secondary | ICD-10-CM

## 2023-09-30 DIAGNOSIS — H66003 Acute suppurative otitis media without spontaneous rupture of ear drum, bilateral: Secondary | ICD-10-CM | POA: Diagnosis not present

## 2023-09-30 MED ORDER — AMOXICILLIN-POT CLAVULANATE 875-125 MG PO TABS: 1.0000 | ORAL_TABLET | Freq: Two times a day (BID) | ORAL | 0 refills | Status: AC

## 2023-09-30 NOTE — Discharge Instructions (Addendum)
 Take the Augmentin twice daily for 7 days with food for treatment of your ear infection.  Take an over-the-counter probiotic 1 hour after each dose of antibiotic to prevent diarrhea.  Use over-the-counter Tylenol and ibuprofen as needed for pain or fever.  Place a hot water bottle, or heating pad, underneath your pillowcase at night to help dilate up your ear and aid in pain relief as well as resolution of the infection.  Return for reevaluation for any new or worsening symptoms.

## 2023-09-30 NOTE — ED Provider Notes (Signed)
 [facility]    CSN: [id number] Arrival date & time: 09/30/23  0806      History   Chief Complaint Chief Complaint  Patient presents with   Otalgia    Bilateral    HPI Marc Dominguez is a 48 y.o. male.   HPI  48 year old male with past medical history significant for heart failure with mildly reduced ejection fraction, bradycardia,'s syncope, and shortness breath on exertion presents for evaluation of bilateral ear pain.  The right is greater than the left.  He is complaining of pain in the entire right side of his head as well as decreased hearing in the right ear.  No URI symptoms.  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 Authorizing Provider  amoxicillin -clavulanate (AUGMENTIN ) 875-125 MG tablet Take 1 tablet by mouth every 12 (twelve) hours for 7 days. 09/30/23 10/07/23 Yes Kent Pear, NP    Family History Family History  Problem Relation Age of Onset   Hyperlipidemia Mother    Hypertension Mother    Diabetes Mother    Cancer Father        lymphoma   Hypertension Sister    Hyperlipidemia Brother    Hypertension Brother    Alcohol abuse Brother    Hypertension Brother     Hypertension Brother    Hypertension Brother     Social History Social History   Tobacco Use   Smoking status: Never   Smokeless tobacco: Current    Types: Chew  Vaping Use   Vaping status: Never Used  Substance Use Topics   Alcohol use: Yes    Comment: 6-8 beers on the weekends   Drug use: No     Allergies   Patient has no known allergies.   Review of Systems Review of Systems  Constitutional:  Negative for fever.  HENT:  Positive for ear pain and hearing loss. Negative for congestion, ear discharge and rhinorrhea.   Respiratory:  Negative for cough.      Physical Exam Triage Vital Signs ED Triage Vitals  Encounter Vitals Group     BP      Systolic BP Percentile      Diastolic BP Percentile      Pulse      Resp      Temp      Temp src      SpO2      Weight      Height      Head Circumference      Peak Flow      Pain Score      Pain Loc      Pain Education  Exclude from Growth Chart    No data found.  Updated Vital Signs BP 132/80 (BP Location: Left Arm)   Pulse (!) 40 Comment: Pt states he sees cardio for bradicardia and this is normal  Temp 98 F (36.7 C) (Oral)   Resp 18   Ht 5\' 10"  (1.778 m)   Wt 162 lb 14.7 oz (73.9 kg)   SpO2 97%   BMI 23.38 kg/m   Visual Acuity Right Eye Distance:   Left Eye Distance:   Bilateral Distance:    Right Eye Near:   Left Eye Near:    Bilateral Near:     Physical Exam Vitals and nursing note reviewed.  Constitutional:      Appearance: Normal appearance. He is not ill-appearing.  HENT:     Head: Normocephalic and atraumatic.     Right Ear: External ear normal.     Left Ear: Ear canal and external ear normal.     Ears:     Comments: Right external auditory canal is occluded by cerumen.  Left EAC is clear.  The superior aspect of the tympanic membrane is erythematous and injected. Neurological:     Mental Status: He is alert.      UC Treatments / Results  Labs (all labs ordered are listed,  but only abnormal results are displayed) Labs Reviewed - No data to display  EKG   Radiology No results found.  Procedures Procedures (including critical care time)  Medications Ordered in UC Medications - No data to display  Initial Impression / Assessment and Plan / UC Course  I have reviewed the triage vital signs and the nursing notes.  Pertinent labs & imaging results that were available during my care of the patient were reviewed by me and considered in my medical decision making (see chart for details).   Patient is presenting with complaints of pain in both of his ears, more so on the right than the left.  He indicates that the whole right side of his head is tender.  He does have tenderness with movement of the auricle of the ear.  The right EAC is occluded by tan cerumen.  The left EAC is clear but the tympanic membrane is erythematous on the superior half.  The patient has a resting heart rate of 39, which is normal for this patient.  He does have a history of bradycardia with syncope and collapse but does not have a pacemaker.  He is followed by cardiology.  I will have staff lavaged his right ear and I have indicated to them that the need to use warm water.  I will also have them keep a continuous pulse ox on the patient so they can monitor his heart rate to prevent bradycardia from the ear irrigation.  Ear lavage resulted in the removal of the end of a Q-tip.  Patient reports that his ear pain has improved.  Inspection of the right EAC reveals a clear EAC and erythematous tympanic membrane.  I will discharge patient home with a diagnosis of foreign body in the right ear as well as bilateral otitis media.  I will start him on Augmentin  875 mg twice daily with food for 7 days.  Tylenol and ibuprofen Cee for pain.  Return precautions reviewed.   Final Clinical Impressions(s) / UC Diagnoses   Final diagnoses:  Non-recurrent acute suppurative otitis media of both ears without  spontaneous rupture of tympanic membranes  Foreign body in right ear, initial encounter  Discharge Instructions      Take the Augmentin  twice daily for 7 days with food for treatment of your ear infection.  Take an over-the-counter probiotic 1 hour after each dose of antibiotic to prevent diarrhea.  Use over-the-counter Tylenol and ibuprofen as needed for pain or fever.  Place a hot water bottle, or heating pad, underneath your pillowcase at night to help dilate up your ear and aid in pain relief as well as resolution of the infection.  Return for reevaluation for any new or worsening symptoms.    ED Prescriptions     Medication Sig Dispense Auth. Provider   amoxicillin -clavulanate (AUGMENTIN ) 875-125 MG tablet Take 1 tablet by mouth every 12 (twelve) hours for 7 days. 14 tablet Kent Pear, NP      PDMP not reviewed this encounter.   Kent Pear, NP 09/30/23 [phone]

## 2023-09-30 NOTE — ED Triage Notes (Signed)
 Pt c/o bilateral ear pain. Right greater than left. Started about 2 days ago. He states the entire right side of his face hurts.

## 2023-10-15 ENCOUNTER — Ambulatory Visit (INDEPENDENT_AMBULATORY_CARE_PROVIDER_SITE_OTHER)

## 2023-10-15 DIAGNOSIS — R55 Syncope and collapse: Secondary | ICD-10-CM

## 2023-10-15 LAB — CUP PACEART REMOTE DEVICE CHECK
Date Time Interrogation Session: 20250614232115
Implantable Pulse Generator Implant Date: 20230920

## 2023-10-16 ENCOUNTER — Ambulatory Visit: Payer: Self-pay | Admitting: Cardiology

## 2023-10-23 NOTE — Progress Notes (Signed)
 [facility] Summary Report / Loop Recorder

## 2023-11-15 ENCOUNTER — Ambulatory Visit (INDEPENDENT_AMBULATORY_CARE_PROVIDER_SITE_OTHER)

## 2023-11-15 DIAGNOSIS — R55 Syncope and collapse: Secondary | ICD-10-CM | POA: Diagnosis not present

## 2023-11-15 LAB — CUP PACEART REMOTE DEVICE CHECK
Date Time Interrogation Session: 20250716234327
Implantable Pulse Generator Implant Date: 20230920

## 2023-11-17 ENCOUNTER — Ambulatory Visit: Payer: Self-pay | Admitting: Cardiology

## 2023-11-22 NOTE — Progress Notes (Signed)
 [facility] Summary Report / Loop Recorder

## 2023-12-17 ENCOUNTER — Ambulatory Visit (INDEPENDENT_AMBULATORY_CARE_PROVIDER_SITE_OTHER)

## 2023-12-17 DIAGNOSIS — R55 Syncope and collapse: Secondary | ICD-10-CM

## 2023-12-18 LAB — CUP PACEART REMOTE DEVICE CHECK
Date Time Interrogation Session: 20250816232725
Implantable Pulse Generator Implant Date: 20230920

## 2023-12-21 ENCOUNTER — Ambulatory Visit: Payer: Self-pay | Admitting: Cardiology

## 2024-01-17 ENCOUNTER — Ambulatory Visit: Payer: Self-pay | Admitting: Cardiology

## 2024-01-17 ENCOUNTER — Ambulatory Visit (INDEPENDENT_AMBULATORY_CARE_PROVIDER_SITE_OTHER)

## 2024-01-17 DIAGNOSIS — R55 Syncope and collapse: Secondary | ICD-10-CM | POA: Diagnosis not present

## 2024-01-17 LAB — CUP PACEART REMOTE DEVICE CHECK
Date Time Interrogation Session: 20250917232740
Implantable Pulse Generator Implant Date: 20230920

## 2024-01-22 NOTE — Progress Notes (Signed)
 Remote Loop Recorder Transmission

## 2024-01-23 NOTE — Progress Notes (Signed)
 Remote Loop Recorder Transmission

## 2024-02-05 NOTE — Progress Notes (Signed)
 Remote Loop Recorder Transmission

## 2024-02-13 ENCOUNTER — Ambulatory Visit
Admission: RE | Admit: 2024-02-13 | Discharge: 2024-02-13 | Disposition: A | Discharge status: Home / Self Care | Payer: Self-pay | Source: Ambulatory Visit | Attending: Family Medicine | Admitting: Family Medicine

## 2024-02-13 VITALS — BP 127/71 | HR 46 | Temp 98.1°F | Resp 18

## 2024-02-13 DIAGNOSIS — H60502 Unspecified acute noninfective otitis externa, left ear: Secondary | ICD-10-CM

## 2024-02-13 MED ORDER — OFLOXACIN 0.3 % OT SOLN: 5.0000 [drp] | Freq: Two times a day (BID) | OTIC | 0 refills | Status: AC

## 2024-02-13 NOTE — ED Provider Notes (Signed)
 [facility]    CSN: [id number] Arrival date & time: 02/13/24  0807      History   Chief Complaint Chief Complaint  Patient presents with   Otalgia    HPI Marc Dominguez is a 48 y.o. male presents for ear pain.  Patient reports 8 to 10 days of left ear pain with muffled hearing.  Denies drainage, fevers, URI symptoms.  Does use Q-tips.  Denies excessive water in the ear.  Has been using heating pads and over-the-counter swimmers eardrops without improvement.  No other concerns at this time.   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 Authorizing Provider  ofloxacin (FLOXIN) 0.3 % OTIC solution Place 5 drops into the left ear 2 (two) times daily for 7 days. 02/13/24 02/20/24 Yes Loreda Myla SAUNDERS, NP    Family History Family History  Problem Relation Age of Onset   Hyperlipidemia Mother    Hypertension Mother    Diabetes Mother    Cancer Father        lymphoma   Hypertension Sister    Hyperlipidemia Brother    Hypertension Brother    Alcohol abuse Brother    Hypertension Brother    Hypertension Brother    Hypertension Brother     Social History Social History    Tobacco Use   Smoking status: Never   Smokeless tobacco: Current    Types: Chew  Vaping Use   Vaping status: Never Used  Substance Use Topics   Alcohol use: Yes    Comment: 6-8 beers on the weekends   Drug use: No     Allergies   Patient has no known allergies.   Review of Systems Review of Systems  HENT:  Positive for ear pain.        Muffled hearing     Physical Exam Triage Vital Signs ED Triage Vitals  Encounter Vitals Group     BP 02/13/24 0833 127/71     Girls Systolic BP Percentile --      Girls Diastolic BP Percentile --      Boys Systolic BP Percentile --      Boys Diastolic BP Percentile --      Pulse Rate 02/13/24 0833 (!) 46     Resp 02/13/24 0833 18     Temp 02/13/24 0833 98.1 F (36.7 C)     Temp Source 02/13/24 0833 Oral     SpO2 02/13/24 0833 96 %     Weight --      Height --      Head Circumference --      Peak Flow --  Pain Score 02/13/24 0834 1     Pain Loc --      Pain Education --      Exclude from Growth Chart --    No data found.  Updated Vital Signs BP 127/71 (BP Location: Right Arm)   Pulse (!) 46 Comment: at baseline  Temp 98.1 F (36.7 C) (Oral)   Resp 18   SpO2 96%   Visual Acuity Right Eye Distance:   Left Eye Distance:   Bilateral Distance:    Right Eye Near:   Left Eye Near:    Bilateral Near:     Physical Exam Vitals and nursing note reviewed.  Constitutional:      General: He is not in acute distress.    Appearance: Normal appearance. He is not ill-appearing.  HENT:     Head: Normocephalic and atraumatic.     Right Ear: Tympanic membrane and ear canal normal.     Left Ear: Drainage and swelling present.     Ears:     Comments: Partial TM visible is within normal limits Eyes:     Pupils: Pupils are equal, round, and reactive to light.  Cardiovascular:     Rate and Rhythm: Bradycardia present.  Pulmonary:     Effort: Pulmonary effort is normal.  Skin:    General: Skin is warm and dry.   Neurological:     General: No focal deficit present.     Mental Status: He is alert and oriented to person, place, and time.  Psychiatric:        Mood and Affect: Mood normal.        Behavior: Behavior normal.      UC Treatments / Results  Labs (all labs ordered are listed, but only abnormal results are displayed) Labs Reviewed - No data to display  EKG   Radiology No results found.  Procedures Procedures (including critical care time)  Medications Ordered in UC Medications - No data to display  Initial Impression / Assessment and Plan / UC Course  I have reviewed the triage vital signs and the nursing notes.  Pertinent labs & imaging results that were available during my care of the patient were reviewed by me and considered in my medical decision making (see chart for details).     Reviewed exam and symptoms with patient.  No red flags.  Will start antibiotic eardrops as prescribed.  Advise no water in ear and not to use Q-tips.  PCP follow-up if symptoms do not improve.  ER precautions reviewed Final Clinical Impressions(s) / UC Diagnoses   Final diagnoses:  Acute otitis externa of left ear, unspecified type     Discharge Instructions      Start ofloxacin antibiotic eardrops twice daily for 7 days.  Keep water out of the ear until treatment is complete.  You may use Tylenol or ibuprofen as needed for pain.  Please follow-up with your PCP if your symptoms do not improve.  Please go to the ER for any worsening symptoms.  Hope you feel better soon!    ED Prescriptions     Medication Sig Dispense Auth. Provider   ofloxacin (FLOXIN) 0.3 % OTIC solution Place 5 drops into the left ear 2 (two) times daily for 7 days. 5 mL Kyelle Urbas, Jodi R, NP      PDMP not reviewed this encounter.   Loreda Myla SAUNDERS, NP 02/13/24 [phone]

## 2024-02-13 NOTE — Discharge Instructions (Signed)
 Start ofloxacin antibiotic eardrops twice daily for 7 days.  Keep water out of the ear until treatment is complete.  You may use Tylenol or ibuprofen as needed for pain.  Please follow-up with your PCP if your symptoms do not improve.  Please go to the ER for any worsening symptoms.  Hope you feel better soon!

## 2024-02-13 NOTE — ED Triage Notes (Signed)
 Pt reports L ear pain x 8-10 days. Tender to touch. States last time this happened there was something stuck in it. No drainage, no fever.   (States low HR is normal)

## 2024-02-18 ENCOUNTER — Encounter

## 2024-02-18 ENCOUNTER — Ambulatory Visit: Payer: Self-pay | Attending: Cardiology

## 2024-02-18 DIAGNOSIS — R55 Syncope and collapse: Secondary | ICD-10-CM

## 2024-02-19 LAB — CUP PACEART REMOTE DEVICE CHECK
Date Time Interrogation Session: 20251019232647
Implantable Pulse Generator Implant Date: 20230920

## 2024-02-22 NOTE — Progress Notes (Signed)
 Remote Loop Recorder Transmission

## 2024-02-25 ENCOUNTER — Ambulatory Visit: Payer: Self-pay | Admitting: Cardiology

## 2024-03-05 ENCOUNTER — Telehealth: Payer: Self-pay | Admitting: Cardiology

## 2024-03-05 NOTE — Telephone Encounter (Signed)
 Pt states due to insurance change he needs to see about doing away with the monthly remote checks. Pt would like a c/b please advise

## 2024-03-05 NOTE — Telephone Encounter (Signed)
 Remotes discontinued and patient taken out of [employer] and made inactive in [employer]

## 2024-03-20 ENCOUNTER — Encounter

## 2024-03-20 ENCOUNTER — Ambulatory Visit: Payer: Self-pay

## 2024-04-20 ENCOUNTER — Ambulatory Visit: Payer: Self-pay

## 2024-04-21 ENCOUNTER — Encounter

## 2024-05-21 ENCOUNTER — Ambulatory Visit: Payer: Self-pay

## 2024-05-22 ENCOUNTER — Encounter

## 2024-06-21 ENCOUNTER — Ambulatory Visit: Payer: Self-pay

## 2024-06-23 ENCOUNTER — Encounter

## 2024-07-22 ENCOUNTER — Ambulatory Visit: Payer: Self-pay

## 2024-07-24 ENCOUNTER — Encounter

## 2024-08-22 ENCOUNTER — Ambulatory Visit: Payer: Self-pay

## 2024-08-25 ENCOUNTER — Encounter

## 2024-09-25 ENCOUNTER — Encounter
